# Patient Record
Sex: Female | Born: 1990
Health system: Southern US, Community
[De-identification: ages and names within clinical notes are randomized; demographics above are authoritative.]

## PROBLEM LIST (undated history)

## (undated) DIAGNOSIS — K219 Gastro-esophageal reflux disease without esophagitis: Secondary | ICD-10-CM

## (undated) DIAGNOSIS — F32A Depression, unspecified: Secondary | ICD-10-CM

## (undated) DIAGNOSIS — F329 Major depressive disorder, single episode, unspecified: Secondary | ICD-10-CM

## (undated) DIAGNOSIS — D241 Benign neoplasm of right breast: Secondary | ICD-10-CM

## (undated) DIAGNOSIS — D242 Benign neoplasm of left breast: Secondary | ICD-10-CM

## (undated) DIAGNOSIS — F419 Anxiety disorder, unspecified: Secondary | ICD-10-CM

## (undated) DIAGNOSIS — E282 Polycystic ovarian syndrome: Secondary | ICD-10-CM

## (undated) DIAGNOSIS — R002 Palpitations: Secondary | ICD-10-CM

## (undated) HISTORY — PX: WISDOM TOOTH EXTRACTION: SHX21

## (undated) HISTORY — DX: Gastro-esophageal reflux disease without esophagitis: K21.9

## (undated) HISTORY — DX: Polycystic ovarian syndrome: E28.2

## (undated) HISTORY — DX: Depression, unspecified: F32.A

## (undated) HISTORY — DX: Palpitations: R00.2

## (undated) HISTORY — DX: Benign neoplasm of right breast: D24.2

## (undated) HISTORY — DX: Benign neoplasm of right breast: D24.1

---
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
## 2012-03-11 ENCOUNTER — Emergency Department (HOSPITAL_COMMUNITY)
Admission: EM | Admit: 2012-03-11 | Discharge: 2012-03-11 | Disposition: A | Discharge status: Home / Self Care | Payer: No Typology Code available for payment source | Attending: Emergency Medicine | Admitting: Emergency Medicine

## 2012-03-11 ENCOUNTER — Encounter (HOSPITAL_COMMUNITY): Payer: Self-pay | Admitting: *Deleted

## 2012-03-11 DIAGNOSIS — R51 Headache: Secondary | ICD-10-CM | POA: Insufficient documentation

## 2012-03-11 DIAGNOSIS — Y92410 Unspecified street and highway as the place of occurrence of the external cause: Secondary | ICD-10-CM | POA: Insufficient documentation

## 2012-03-11 MED ORDER — IBUPROFEN 600 MG PO TABS: 600.0000 mg | ORAL_TABLET | Freq: Once | ORAL | Status: AC

## 2012-03-11 MED ORDER — IBUPROFEN 200 MG PO TABS
600.0000 mg | ORAL_TABLET | Freq: Once | ORAL | Status: AC
  Administered 2012-03-11: 600 mg via ORAL
  Filled 2012-03-11: qty 3

## 2012-03-11 NOTE — ED Provider Notes (Signed)
Medical screening examination/treatment/procedure(s) were performed by non-physician practitioner and as supervising physician I was immediately available for consultation/collaboration.   Loren Racer, MD 03/11/12 2322

## 2012-03-11 NOTE — Discharge Instructions (Signed)
° °

## 2012-03-11 NOTE — ED Notes (Signed)
rx x 0, pt voiced understanding to f/u with PCP and return for worsening condition.
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
## 2015-06-19 ENCOUNTER — Encounter (HOSPITAL_BASED_OUTPATIENT_CLINIC_OR_DEPARTMENT_OTHER): Payer: Self-pay | Admitting: *Deleted

## 2015-06-19 ENCOUNTER — Emergency Department (HOSPITAL_BASED_OUTPATIENT_CLINIC_OR_DEPARTMENT_OTHER): Payer: 59

## 2015-06-19 ENCOUNTER — Emergency Department (HOSPITAL_BASED_OUTPATIENT_CLINIC_OR_DEPARTMENT_OTHER)
Admission: EM | Admit: 2015-06-19 | Discharge: 2015-06-19 | Disposition: A | Discharge status: Home / Self Care | Payer: 59 | Attending: Emergency Medicine | Admitting: Emergency Medicine

## 2015-06-19 DIAGNOSIS — Z3202 Encounter for pregnancy test, result negative: Secondary | ICD-10-CM | POA: Insufficient documentation

## 2015-06-19 DIAGNOSIS — R112 Nausea with vomiting, unspecified: Secondary | ICD-10-CM

## 2015-06-19 DIAGNOSIS — R109 Unspecified abdominal pain: Secondary | ICD-10-CM | POA: Diagnosis not present

## 2015-06-19 DIAGNOSIS — Z79899 Other long term (current) drug therapy: Secondary | ICD-10-CM | POA: Insufficient documentation

## 2015-06-19 LAB — COMPREHENSIVE METABOLIC PANEL
ALBUMIN: 3.7 g/dL (ref 3.5–5.0)
ALT: 17 U/L (ref 14–54)
AST: 27 U/L (ref 15–41)
Alkaline Phosphatase: 64 U/L (ref 38–126)
Anion gap: 13 (ref 5–15)
BILIRUBIN TOTAL: 0.8 mg/dL (ref 0.3–1.2)
BUN: 26 mg/dL — AB (ref 6–20)
CHLORIDE: 103 mmol/L (ref 101–111)
CO2: 22 mmol/L (ref 22–32)
Calcium: 9.7 mg/dL (ref 8.9–10.3)
Creatinine, Ser: 0.84 mg/dL (ref 0.44–1.00)
GFR calc Af Amer: >60 mL/min (ref >60)
GFR calc non Af Amer: >60 mL/min (ref >60)
GLUCOSE: 94 mg/dL (ref 65–99)
POTASSIUM: 3.9 mmol/L (ref 3.5–5.1)
Sodium: 138 mmol/L (ref 135–145)
Total Protein: 8 g/dL (ref 6.5–8.1)

## 2015-06-19 LAB — CBC WITH DIFFERENTIAL/PLATELET
Basophils Absolute: 0 10*3/uL (ref 0.0–0.1)
Basophils Relative: 0 % (ref 0–1)
EOS PCT: 0 % (ref 0–5)
Eosinophils Absolute: 0 10*3/uL (ref 0.0–0.7)
HCT: 41 % (ref 36.0–46.0)
Hemoglobin: 14.3 g/dL (ref 12.0–15.0)
LYMPHS ABS: 1 10*3/uL (ref 0.7–4.0)
LYMPHS PCT: 11 % — AB (ref 12–46)
MCH: 28.8 pg (ref 26.0–34.0)
MCHC: 34.9 g/dL (ref 30.0–36.0)
MCV: 82.7 fL (ref 78.0–100.0)
MONO ABS: 0.3 10*3/uL (ref 0.1–1.0)
MONOS PCT: 4 % (ref 3–12)
Neutro Abs: 7.2 10*3/uL (ref 1.7–7.7)
Neutrophils Relative %: 85 % — ABNORMAL HIGH (ref 43–77)
PLATELETS: 245 10*3/uL (ref 150–400)
RBC: 4.96 MIL/uL (ref 3.87–5.11)
RDW: 12.1 % (ref 11.5–15.5)
WBC: 8.5 10*3/uL (ref 4.0–10.5)

## 2015-06-19 LAB — URINALYSIS, ROUTINE W REFLEX MICROSCOPIC
GLUCOSE, UA: NEGATIVE mg/dL
Hgb urine dipstick: NEGATIVE
Ketones, ur: >80 mg/dL — AB
LEUKOCYTES UA: NEGATIVE
Nitrite: NEGATIVE
PROTEIN: 30 mg/dL — AB
Specific Gravity, Urine: 1.031 — ABNORMAL HIGH (ref 1.005–1.030)
Urobilinogen, UA: 1 mg/dL (ref 0.0–1.0)
pH: 6 (ref 5.0–8.0)

## 2015-06-19 LAB — URINE MICROSCOPIC-ADD ON

## 2015-06-19 LAB — PREGNANCY, URINE: PREG TEST UR: NEGATIVE

## 2015-06-19 LAB — LIPASE, BLOOD: Lipase: 26 U/L (ref 22–51)

## 2015-06-19 MED ORDER — SODIUM CHLORIDE 0.9 % IV BOLUS (SEPSIS)
1000.0000 mL | Freq: Once | INTRAVENOUS | Status: AC
  Administered 2015-06-19: 1000 mL via INTRAVENOUS

## 2015-06-19 MED ORDER — ONDANSETRON HCL 4 MG/2ML IJ SOLN
4.0000 mg | Freq: Once | INTRAMUSCULAR | Status: AC
Start: 2015-06-19 — End: 2015-06-19
  Administered 2015-06-19: 4 mg via INTRAVENOUS
  Filled 2015-06-19: qty 2

## 2015-06-19 MED ORDER — ONDANSETRON 4 MG PO TBDP: ORAL_TABLET | ORAL | Status: DC

## 2015-06-19 NOTE — ED Notes (Signed)
Pt aware of need to provide urine sample and sts she will as soon as she can.

## 2015-06-19 NOTE — ED Notes (Signed)
Vomiting. Was seen at [facility] and told to come here. Right upper quad pain.

## 2015-06-19 NOTE — Discharge Instructions (Signed)
You were evaluated in the ED today for your nausea and vomiting. There is not appear to be an emergent cause her symptoms at this time. Her labs and ultrasound were very reassuring. It is important she take her medications as prescribed. Follow up with her doctors as needed for reevaluation. Return to ED for new or worsening symptoms.  Nausea and Vomiting Nausea is a sick feeling that often comes before throwing up (vomiting). Vomiting is a reflex where stomach contents come out of your mouth. Vomiting can cause severe loss of body fluids (dehydration). Children and elderly adults can become dehydrated quickly, especially if they also have diarrhea. Nausea and vomiting are symptoms of a condition or disease. It is important to find the cause of your symptoms. CAUSES   Direct irritation of the stomach lining. This irritation can result from increased acid production (gastroesophageal reflux disease), infection, food poisoning, taking certain medicines (such as nonsteroidal anti-inflammatory drugs), alcohol use, or tobacco use.  Signals from the brain.These signals could be caused by a headache, heat exposure, an inner ear disturbance, increased pressure in the brain from injury, infection, a tumor, or a concussion, pain, emotional stimulus, or metabolic problems.  An obstruction in the gastrointestinal tract (bowel obstruction).  Illnesses such as diabetes, hepatitis, gallbladder problems, appendicitis, kidney problems, cancer, sepsis, atypical symptoms of a heart attack, or eating disorders.  Medical treatments such as chemotherapy and radiation.  Receiving medicine that makes you sleep (general anesthetic) during surgery. DIAGNOSIS Your caregiver may ask for tests to be done if the problems do not improve after a few days. Tests may also be done if symptoms are severe or if the reason for the nausea and vomiting is not clear. Tests may include:  Urine tests.  Blood tests.  Stool  tests.  Cultures (to look for evidence of infection).  X-rays or other imaging studies. Test results can help your caregiver make decisions about treatment or the need for additional tests. TREATMENT You need to stay well hydrated. Drink frequently but in small amounts.You may wish to drink water, sports drinks, clear broth, or eat frozen ice pops or gelatin dessert to help stay hydrated.When you eat, eating slowly may help prevent nausea.There are also some antinausea medicines that may help prevent nausea. HOME CARE INSTRUCTIONS   Take all medicine as directed by your caregiver.  If you do not have an appetite, do not force yourself to eat. However, you must continue to drink fluids.  If you have an appetite, eat a normal diet unless your caregiver tells you differently.  Eat a variety of complex carbohydrates (rice, wheat, potatoes, bread), lean meats, yogurt, fruits, and vegetables.  Avoid high-fat foods because they are more difficult to digest.  Drink enough water and fluids to keep your urine clear or pale yellow.  If you are dehydrated, ask your caregiver for specific rehydration instructions. Signs of dehydration may include:  Severe thirst.  Dry lips and mouth.  Dizziness.  Dark urine.  Decreasing urine frequency and amount.  Confusion.  Rapid breathing or pulse. SEEK IMMEDIATE MEDICAL CARE IF:   You have blood or brown flecks (like coffee grounds) in your vomit.  You have black or bloody stools.  You have a severe headache or stiff neck.  You are confused.  You have severe abdominal pain.  You have chest pain or trouble breathing.  You do not urinate at least once every 8 hours.  You develop cold or clammy skin.  You continue to vomit  for longer than 24 to 48 hours.  You have a fever. MAKE SURE YOU:   Understand these instructions.  Will watch your condition.  Will get help right away if you are not doing well or get worse. Document  Released: 10/07/2005 Document Revised: 12/30/2011 Document Reviewed: 03/06/2011 [facility] Patient Information 2015 [place], [place]. This information is not intended to replace advice given to you by your health care provider. Make sure you discuss any questions you have with your health care provider.

## 2015-06-19 NOTE — ED Notes (Signed)
Water given for po challenge.

## 2015-06-19 NOTE — ED Notes (Signed)
Patient transported to Ultrasound 

## 2015-06-19 NOTE — ED Provider Notes (Signed)
CSN: [id number]     Arrival date & time 06/19/15  1300 History   First MD Initiated Contact with Patient 06/19/15 1313     Chief Complaint  Patient presents with  . Nausea     (Consider location/radiation/quality/duration/timing/severity/associated sxs/prior Treatment) HPI Darlene Wright is a 24 y.o. female who comes in for evaluation of nausea and abdominal pain. Patient states this morning at approximately 9 AM she began to have sudden onset nausea with vomiting. She reports she has been unable to keep down any fluids including water and ginger ale. She was seen at an urgent care facility and referred to the ED area she reports in the last 30 minutes she has also developed a right upper quadrant pain that has been somewhat constant in nature. Worse with palpation and deep respiration. She reports moderate alcohol use tonight prior to her symptoms, and estimates she drank roughly 4 shots. She also reports dark urine for the past 2 days but no dysuria or hematuria. She denies any fevers, chills, pelvic pain. She reports her last menstrual cycle was supposed to be 3 weeks ago, but she took the active pills in her birth control in order to avoid having a cycle this month due to ongoing anxiety. Rates discomfort now as a 2/10.  History reviewed. No pertinent past medical history. History reviewed. No pertinent past surgical history. No family history on file. Social History  Substance Use Topics  . Smoking status: Never Smoker   . Smokeless tobacco: None  . Alcohol Use: No   OB History    No data available     Review of Systems A 10 point review of systems was completed and was negative except for pertinent positives and negatives as mentioned in the history of present illness     Allergies  Review of patient's allergies indicates no known allergies.  Home Medications   Prior to Admission medications   Medication Sig Start Date End Date Taking? Authorizing Provider  FLUoxetine HCl  (PROZAC PO) Take by mouth.   Yes Historical Provider, MD  desogestrel-ethinyl estradiol (APRI,EMOQUETTE,SOLIA) 0.15-30 MG-MCG tablet Take 1 tablet by mouth daily.    Historical Provider, MD  ondansetron (ZOFRAN ODT) 4 MG disintegrating tablet 4mg  ODT q4 hours prn nausea/vomit 06/19/15   Emmajo Bennette, PA-C   BP 110/57 mmHg  Pulse 57  Temp(Src) 98.5 F (36.9 C) (Oral)  Resp 20  Ht 5' 5.5" (1.664 m)  Wt 127 lb (57.607 kg)  BMI 20.81 kg/m2  SpO2 100% Physical Exam  Constitutional: She is oriented to person, place, and time. She appears well-developed and well-nourished.  HENT:  Head: Normocephalic and atraumatic.  Mouth/Throat: Oropharynx is clear and moist.  Eyes: Conjunctivae are normal. Pupils are equal, round, and reactive to light. Right eye exhibits no discharge. Left eye exhibits no discharge. No scleral icterus.  Neck: Normal range of motion. Neck supple.  Cardiovascular: Normal rate, regular rhythm and normal heart sounds.   Pulmonary/Chest: Effort normal and breath sounds normal. No respiratory distress. She has no wheezes. She has no rales.  Abdominal: Soft.  Diffuse epigastric and right upper quadrant tenderness. Positive Murphy sign. Abdomen is soft, nondistended. No other lesions or deformities noted. CVA tenderness on right side  Musculoskeletal: Normal range of motion. She exhibits no tenderness.  Neurological: She is alert and oriented to person, place, and time.  Cranial Nerves II-XII grossly intact  Skin: Skin is warm and dry. No rash noted.  Psychiatric: She has a normal mood  and affect.  Nursing note and vitals reviewed.   ED Course  Procedures (including critical care time) Labs Review Labs Reviewed  URINALYSIS, ROUTINE W REFLEX MICROSCOPIC (NOT AT [facility]) - Abnormal; Notable for the following:    Color, Urine AMBER (*)    APPearance CLOUDY (*)    Specific Gravity, Urine 1.031 (*)    Bilirubin Urine SMALL (*)    Ketones, ur >80 (*)    Protein, ur 30 (*)     All other components within normal limits  COMPREHENSIVE METABOLIC PANEL - Abnormal; Notable for the following:    BUN 26 (*)    All other components within normal limits  CBC WITH DIFFERENTIAL/PLATELET - Abnormal; Notable for the following:    Neutrophils Relative % 85 (*)    Lymphocytes Relative 11 (*)    All other components within normal limits  URINE MICROSCOPIC-ADD ON - Abnormal; Notable for the following:    Squamous Epithelial / LPF FEW (*)    Bacteria, UA FEW (*)    All other components within normal limits  PREGNANCY, URINE  LIPASE, BLOOD    Imaging Review US Abdomen Complete  06/19/2015   CLINICAL DATA:  Right upper quadrant pain beginning at noon today. Nausea and vomiting prior earlier in the day. Initial encounter.  EXAM: ULTRASOUND ABDOMEN COMPLETE  COMPARISON:  None.  FINDINGS: Gallbladder: No gallstones or wall thickening visualized. No sonographic Murphy sign noted.  Common bile duct: Diameter: 0.3 cm  Liver: No focal lesion identified. Within normal limits in parenchymal echogenicity.  IVC: No abnormality visualized.  Pancreas: Visualized portion unremarkable.  Spleen: Size and appearance within normal limits.  Right Kidney: Length: 10.4 cm. Echogenicity within normal limits. No mass or hydronephrosis visualized.  Left Kidney: Length: 9.0 cm. Echogenicity within normal limits. No mass or hydronephrosis visualized.  Abdominal aorta: No aneurysm visualized.  Other findings: None.  IMPRESSION: Negative for gallstones.  Negative exam.   Electronically Signed   By: Inge Rise M.D.   On: 06/19/2015 15:46   I have personally reviewed and evaluated these images and lab results as part of my medical decision-making.   EKG Interpretation None     Meds given in ED:  Medications  sodium chloride 0.9 % bolus 1,000 mL (0 mLs Intravenous Stopped 06/19/15 1415)  ondansetron (ZOFRAN) injection 4 mg (4 mg Intravenous Given 06/19/15 1346)  ondansetron (ZOFRAN) injection 4 mg (4  mg Intravenous Given 06/19/15 1549)    Discharge Medication List as of 06/19/2015  4:28 PM     Filed Vitals:   06/19/15 1307 06/19/15 1536 06/19/15 1641  BP: 112/83 94/56 110/57  Pulse: 95 64 57  Temp: 97.5 F (36.4 C) 98.5 F (36.9 C)   TempSrc: Oral Oral   Resp: 18 18 20   Height: 5' 5.5" (1.664 m)    Weight: 127 lb (57.607 kg)    SpO2: 100% 100% 100%    MDM  Vitals stable - WNL -afebrile Pt resting comfortably in ED. PE-- Diffuse RUQ pain with otherwise benign abd exam. When palpating CVA tenderness, patient reports discomfort anteriorly in right upper quadrant. Labwork--labs are noncontributory Imaging--US abdomen negative  DDX--patient is tolerating oral fluids in the ED without vomiting. Reports she feels much better. Right upper quadrant pain has abated. Symptoms possibly secondary to biliary spasm. No evidence of cholelithiasis, cholecystitis. No evidence of ureteral stone, hydronephrosis or pyelonephritis. Doubt appendicitis. Low suspicion for any other intra-abdominal pathology. Will DC with antinausea medicines and instructions for hydration at home.  I discussed all relevant lab findings and imaging results with pt and they verbalized understanding. Discussed f/u with PCP within 48 hrs and return precautions, pt very amenable to plan.  Final diagnoses:  Non-intractable vomiting with nausea, vomiting of unspecified type      Comer Locket, PA-C 06/20/15 5521  Malvin Johns, MD 06/20/15 [phone]

## 2016-10-19 ENCOUNTER — Emergency Department (HOSPITAL_BASED_OUTPATIENT_CLINIC_OR_DEPARTMENT_OTHER): Payer: 59

## 2016-10-19 ENCOUNTER — Emergency Department (HOSPITAL_BASED_OUTPATIENT_CLINIC_OR_DEPARTMENT_OTHER)
Admission: EM | Admit: 2016-10-19 | Discharge: 2016-10-20 | Disposition: A | Discharge status: Home / Self Care | Payer: 59 | Attending: Emergency Medicine | Admitting: Emergency Medicine

## 2016-10-19 ENCOUNTER — Encounter (HOSPITAL_BASED_OUTPATIENT_CLINIC_OR_DEPARTMENT_OTHER): Payer: Self-pay | Admitting: Emergency Medicine

## 2016-10-19 DIAGNOSIS — B9789 Other viral agents as the cause of diseases classified elsewhere: Secondary | ICD-10-CM

## 2016-10-19 DIAGNOSIS — J069 Acute upper respiratory infection, unspecified: Secondary | ICD-10-CM | POA: Insufficient documentation

## 2016-10-19 DIAGNOSIS — R0602 Shortness of breath: Secondary | ICD-10-CM

## 2016-10-19 DIAGNOSIS — J40 Bronchitis, not specified as acute or chronic: Secondary | ICD-10-CM | POA: Insufficient documentation

## 2016-10-19 HISTORY — DX: Anxiety disorder, unspecified: F41.9

## 2016-10-19 LAB — CBC WITH DIFFERENTIAL/PLATELET
BASOS ABS: 0 10*3/uL (ref 0.0–0.1)
BASOS PCT: 0 %
Eosinophils Absolute: 0.2 10*3/uL (ref 0.0–0.7)
Eosinophils Relative: 2 %
HEMATOCRIT: 41 % (ref 36.0–46.0)
Hemoglobin: 14.2 g/dL (ref 12.0–15.0)
Lymphocytes Relative: 21 %
Lymphs Abs: 2.1 10*3/uL (ref 0.7–4.0)
MCH: 28.6 pg (ref 26.0–34.0)
MCHC: 34.6 g/dL (ref 30.0–36.0)
MCV: 82.7 fL (ref 78.0–100.0)
MONO ABS: 1.1 10*3/uL — AB (ref 0.1–1.0)
Monocytes Relative: 10 %
NEUTROS ABS: 6.9 10*3/uL (ref 1.7–7.7)
NEUTROS PCT: 67 %
Platelets: 227 10*3/uL (ref 150–400)
RBC: 4.96 MIL/uL (ref 3.87–5.11)
RDW: 12.3 % (ref 11.5–15.5)
WBC: 10.3 10*3/uL (ref 4.0–10.5)

## 2016-10-19 LAB — PREGNANCY, URINE: PREG TEST UR: NEGATIVE

## 2016-10-19 LAB — D-DIMER, QUANTITATIVE: D-Dimer, Quant: <0.27 ug/mL-FEU (ref 0.00–0.50)

## 2016-10-19 MED ORDER — ALBUTEROL SULFATE (2.5 MG/3ML) 0.083% IN NEBU
5.0000 mg | INHALATION_SOLUTION | Freq: Once | RESPIRATORY_TRACT | Status: AC
  Administered 2016-10-19: 5 mg via RESPIRATORY_TRACT
  Filled 2016-10-19: qty 6

## 2016-10-19 MED ORDER — IOPAMIDOL (ISOVUE-370) INJECTION 76%
100.0000 mL | Freq: Once | INTRAVENOUS | Status: AC | PRN
  Administered 2016-10-19: 100 mL via INTRAVENOUS

## 2016-10-19 MED ORDER — LORAZEPAM 1 MG PO TABS
0.5000 mg | ORAL_TABLET | Freq: Once | ORAL | Status: AC
  Administered 2016-10-19: 0.5 mg via ORAL
  Filled 2016-10-19: qty 1

## 2016-10-19 NOTE — ED Notes (Signed)
Patient transported to X-ray 

## 2016-10-19 NOTE — ED Provider Notes (Signed)
[place] DEPT [facility] Provider Note   CSN: [id number] Arrival date & time: 10/19/16  1551 By signing my name below, I, [name], attest that this documentation has been prepared under the direction and in the presence of non-physician practitioner, Irena Cords, PA-C. Electronically Signed: [name], Scribe. 10/19/2016. 7:52 PM.   History   Chief Complaint Chief Complaint  Patient presents with  . Shortness of Breath   HPI Darlene Wright is a 25 y.o. female with a hx of anxiety who presents to the Emergency Department complaining of shortness of breath onset yesterday. Pt states SOB is exacerbated by talking or exertion. Pt notes associated cough productive of red tinged sputum, back pain, and postnasal drip which has improved. Per pt, her symptoms began with cough three days ago. She has taken OTC cough and cold medication with no relief. Pt was seen at urgent care today and sent to the ED for further evaluation. Pt denies vomiting, diarrhea, abdominal pain, or sore throat.   The history is provided by the patient. No language interpreter was used.   Past Medical History:  Diagnosis Date  . Anxiety     Patient Active Problem List   Diagnosis Date Noted  . BREAST MASS, LEFT 05/08/2009    Past Surgical History:  Procedure Laterality Date  . WISDOM TOOTH EXTRACTION      OB History    No data available      Home Medications    Prior to Admission medications   Medication Sig Start Date End Date Taking? Authorizing Provider  hydrOXYzine (ATARAX/VISTARIL) 10 MG tablet Take 10 mg by mouth 3 (three) times daily as needed.   Yes Historical Provider, MD  desogestrel-ethinyl estradiol (APRI,EMOQUETTE,SOLIA) 0.15-30 MG-MCG tablet Take 1 tablet by mouth daily.    Historical Provider, MD  FLUoxetine HCl (PROZAC PO) Take by mouth.    Historical Provider, MD  ondansetron (ZOFRAN ODT) 4 MG disintegrating tablet 4mg  ODT q4 hours prn nausea/vomit 06/19/15   Comer Locket,  PA-C    Family History No family history on file.  Social History Social History  Substance Use Topics  . Smoking status: Never Smoker  . Smokeless tobacco: Never Used  . Alcohol use Yes     Comment: social    Allergies   Patient has no known allergies.  Review of Systems Review of Systems 10 systems reviewed and all are negative for acute change except as noted in the HPI.   Physical Exam Updated Vital Signs BP 124/77 (BP Location: Right Arm)   Pulse 92   Temp 98.1 F (36.7 C) (Oral)   Resp 20   Ht 5\' 5"  (1.651 m)   Wt 138 lb (62.6 kg)   LMP 10/12/2016   SpO2 100%   BMI 22.96 kg/m   Physical Exam  Constitutional: She is oriented to person, place, and time. She appears well-developed and well-nourished. No distress.  HENT:  Head: Normocephalic and atraumatic.  Mouth/Throat: Oropharynx is clear and moist.  Eyes: Pupils are equal, round, and reactive to light.  Neck: Normal range of motion. Neck supple.  Cardiovascular: Normal rate, regular rhythm and normal heart sounds.  Exam reveals no gallop and no friction rub.   No murmur heard. Pulmonary/Chest: Effort normal and breath sounds normal. No respiratory distress. She has no wheezes.  Abdominal: Soft. Bowel sounds are normal. She exhibits no distension. There is no tenderness.  Musculoskeletal: She exhibits no edema.  Neurological: She is alert and oriented to person, place, and time.  She exhibits normal muscle tone. Coordination normal.  Skin: Skin is warm and dry. Capillary refill takes less than 2 seconds. No rash noted. No erythema.  Psychiatric: Her behavior is normal. Her mood appears anxious.  Nursing note and vitals reviewed.  ED Treatments / Results  DIAGNOSTIC STUDIES:  Oxygen Saturation is 100% on RA, normal by my interpretation.    COORDINATION OF CARE:  7:46 PM Discussed treatment plan with pt at bedside and pt agreed to plan.   Labs (all labs ordered are listed, but only abnormal results are  displayed) Labs Reviewed  CBC WITH DIFFERENTIAL/PLATELET - Abnormal; Notable for the following:       Result Value   Monocytes Absolute 1.1 (*)    All other components within normal limits  PREGNANCY, URINE  D-DIMER, QUANTITATIVE (NOT AT [facility])    EKG  EKG Interpretation None       Radiology Dg Chest 2 View  Result Date: 10/19/2016 CLINICAL DATA:  Complaining of sinus infection since October 16, 2016. Shortness of breath since yesterday. EXAM: CHEST  2 VIEW COMPARISON:  None. FINDINGS: The heart size and mediastinal contours are within normal limits. There is no focal infiltrate, pulmonary edema, or pleural effusion. The visualized skeletal structures are unremarkable. IMPRESSION: No active cardiopulmonary disease. Electronically Signed   By: Abelardo Diesel M.D.   On: 10/19/2016 16:44   Procedures Procedures (including critical care time)  Medications Ordered in ED Medications - No data to display  Initial Impression / Assessment and Plan / ED Course  I have reviewed the triage vital signs and the nursing notes.  Pertinent labs & imaging results that were available during my care of the patient were reviewed by me and considered in my medical decision making (see chart for details).  Clinical Course    Patient most likely has an upper respiratory infection.  We will treat for this.  The patient's heart rate is probably due to the fact that she is anxious, plus the fact she had albuterol patient.  CT of her chest does not show any pulmonary embolus or other abnormality.  I advised the patient of the results and all questions were answered  Final Clinical Impressions(s) / ED Diagnoses   Final diagnoses:  None    New Prescriptions New Prescriptions   No medications on file  I personally performed the services described in this documentation, which was scribed in my presence. The recorded information has been reviewed and is accurate.   Dalia Heading, PA-C 10/20/16  0021    Julianne Rice, MD 10/20/16 [phone]

## 2016-10-19 NOTE — ED Notes (Signed)
Pt taken to CT.

## 2016-10-19 NOTE — ED Notes (Signed)
Pt ambulated around nurses station with HR up to 127 and 02 saturation of 98%.

## 2016-10-19 NOTE — ED Triage Notes (Addendum)
States thought she has a sinus infection and cough and SOB, and was eval at Urgent Care for same and was sent here for further work up. Also feeling" anxious" in triage. States is unable to take a deep breath and pain in mid-back.

## 2016-10-19 NOTE — ED Notes (Signed)
Pt ambulated around dept. HR 140 SATS 100%.

## 2016-10-20 MED ORDER — PREDNISONE 50 MG PO TABS: 50.0000 mg | ORAL_TABLET | Freq: Every day | ORAL | 0 refills | Status: DC

## 2016-10-20 MED ORDER — GUAIFENESIN ER 1200 MG PO TB12: 1.0000 | ORAL_TABLET | Freq: Two times a day (BID) | ORAL | 0 refills | Status: DC

## 2016-10-20 MED ORDER — PROMETHAZINE-DM 6.25-15 MG/5ML PO SYRP: 5.0000 mL | ORAL_SOLUTION | Freq: Four times a day (QID) | ORAL | 0 refills | Status: DC | PRN

## 2016-10-20 NOTE — ED Notes (Signed)
Pt made aware to return if symptoms worsen or if any life threatening symptoms occur.   

## 2016-10-20 NOTE — Discharge Instructions (Signed)
Your CT angiogram of her chest did not show any abnormalities or pulmonary embolism.  You most likely have a upper respiratory infection.  We will give you medications for this.  Increase your fluid intake and rest as much as possible.

## 2017-10-24 DIAGNOSIS — F4322 Adjustment disorder with anxiety: Secondary | ICD-10-CM | POA: Diagnosis not present

## 2017-10-29 ENCOUNTER — Other Ambulatory Visit: Payer: Self-pay | Admitting: Family Medicine

## 2017-10-29 DIAGNOSIS — N6011 Diffuse cystic mastopathy of right breast: Secondary | ICD-10-CM

## 2017-10-29 DIAGNOSIS — N6489 Other specified disorders of breast: Secondary | ICD-10-CM

## 2017-10-29 DIAGNOSIS — N6012 Diffuse cystic mastopathy of left breast: Secondary | ICD-10-CM

## 2017-10-30 DIAGNOSIS — H5211 Myopia, right eye: Secondary | ICD-10-CM | POA: Diagnosis not present

## 2017-11-07 DIAGNOSIS — F4322 Adjustment disorder with anxiety: Secondary | ICD-10-CM | POA: Diagnosis not present

## 2017-12-23 DIAGNOSIS — F4322 Adjustment disorder with anxiety: Secondary | ICD-10-CM | POA: Diagnosis not present

## 2018-01-28 ENCOUNTER — Telehealth: Payer: Self-pay | Admitting: Obstetrics and Gynecology

## 2018-01-28 NOTE — Telephone Encounter (Signed)
Called and left a message for patient to call back to schedule a new patient doctor referral appointment with our office for a birth control consultation.

## 2018-02-04 ENCOUNTER — Ambulatory Visit
Admission: RE | Admit: 2018-02-04 | Discharge: 2018-02-04 | Disposition: A | Discharge status: Home / Self Care | Payer: 59 | Source: Ambulatory Visit | Attending: Family Medicine | Admitting: Family Medicine

## 2018-02-04 DIAGNOSIS — N6012 Diffuse cystic mastopathy of left breast: Secondary | ICD-10-CM

## 2018-02-04 DIAGNOSIS — N6011 Diffuse cystic mastopathy of right breast: Secondary | ICD-10-CM

## 2018-02-04 DIAGNOSIS — N6489 Other specified disorders of breast: Secondary | ICD-10-CM

## 2018-02-04 DIAGNOSIS — F4323 Adjustment disorder with mixed anxiety and depressed mood: Secondary | ICD-10-CM | POA: Diagnosis not present

## 2018-02-04 DIAGNOSIS — D242 Benign neoplasm of left breast: Secondary | ICD-10-CM | POA: Diagnosis not present

## 2018-02-10 DIAGNOSIS — Z13220 Encounter for screening for lipoid disorders: Secondary | ICD-10-CM | POA: Diagnosis not present

## 2018-02-10 DIAGNOSIS — Z131 Encounter for screening for diabetes mellitus: Secondary | ICD-10-CM | POA: Diagnosis not present

## 2018-02-10 DIAGNOSIS — Z1329 Encounter for screening for other suspected endocrine disorder: Secondary | ICD-10-CM | POA: Diagnosis not present

## 2018-02-18 ENCOUNTER — Encounter

## 2018-02-18 ENCOUNTER — Encounter: Payer: Self-pay | Admitting: Obstetrics and Gynecology

## 2018-02-18 ENCOUNTER — Ambulatory Visit: Payer: BLUE CROSS/BLUE SHIELD | Admitting: Obstetrics and Gynecology

## 2018-02-18 ENCOUNTER — Other Ambulatory Visit: Payer: Self-pay

## 2018-02-18 VITALS — BP 114/60 | HR 76 | Resp 16 | Ht 64.5 in | Wt 145.0 lb

## 2018-02-18 DIAGNOSIS — Z01419 Encounter for gynecological examination (general) (routine) without abnormal findings: Secondary | ICD-10-CM | POA: Diagnosis not present

## 2018-02-18 DIAGNOSIS — Z3009 Encounter for other general counseling and advice on contraception: Secondary | ICD-10-CM | POA: Diagnosis not present

## 2018-02-18 NOTE — Patient Instructions (Signed)

## 2018-02-18 NOTE — Progress Notes (Signed)
27 y.o. G39P0000 Married Caucasian female here for annual exam.    Husband present for the entire visit today.   Decreased libido on OCPs.  On Apri for 8 years - Light menses, acne improved.  Decreased libido. Tried another OCP with low estrogen and still had decreased libido.  Considering Paragard IUD.  PCP:    Dr. Antoine Primas   Patient's last menstrual period was 02/10/2018.     Period Cycle (Days): 30 Period Duration (Days): 4-5  Period Pattern: (!) Irregular Menstrual Flow: Light Menstrual Control: Maxi pad, Tampon Dysmenorrhea: None     Sexually active: Yes.    The current method of family planning is OCP (estrogen/progesterone).    Exercising: No.  The patient does not participate in regular exercise at present. Smoker:  no  Health Maintenance: Pap:  12/2015 in [place]  History of abnormal Pap:  no MMG:  02/04/18 - bilateral breast ultrasound- BIRADS 2 benign   Colonoscopy:  never BMD:   never  Result  n/a TDaP:  05/12/15  Gardasil:   Yes- completed all 3  HIV: never Hep C: never Screening Labs:  Hb today: done recently , Urine today: has sample if needed    reports that she has never smoked. She has never used smokeless tobacco. She reports that she drinks alcohol. She reports that she does not use drugs.  Past Medical History:  Diagnosis Date  . Anxiety   . Fibroadenoma of both breasts     Past Surgical History:  Procedure Laterality Date  . WISDOM TOOTH EXTRACTION    . WISDOM TOOTH EXTRACTION      Current Outpatient Medications  Medication Sig Dispense Refill  . hydrOXYzine (ATARAX/VISTARIL) 10 MG tablet Take 10 mg by mouth 3 (three) times daily as needed.    . Multiple Vitamins-Minerals (MULTIVITAMIN PO) Take by mouth.    . SRONYX 0.1-20 MG-MCG tablet Take 1 tablet by mouth daily.  1   No current facility-administered medications for this visit.     Family History  Problem Relation Age of Onset  . Hypertension Mother   . Hypertension  Father   . Diabetes Maternal Grandmother   . Hypertension Maternal Grandmother   . Hypertension Maternal Grandfather   . Hypertension Paternal Grandmother   . Leukemia Paternal Grandfather   . Diabetes Paternal Grandfather   . Hypertension Paternal Grandfather     Review of Systems  Constitutional: Negative.   HENT: Negative.   Eyes: Negative.   Respiratory: Negative.   Cardiovascular: Negative.   Gastrointestinal: Negative.   Endocrine: Negative.   Genitourinary: Negative.   Musculoskeletal: Negative.   Skin: Negative.   Allergic/Immunologic: Negative.   Neurological: Negative.   Hematological: Negative.   Psychiatric/Behavioral: Negative.     Exam:   BP 114/60   Pulse 76   Resp 16   Ht 5' 4.5" (1.638 m)   Wt 145 lb (65.8 kg)   LMP 02/10/2018   BMI 24.50 kg/m     General appearance: alert, cooperative and appears stated age Head: Normocephalic, without obvious abnormality, atraumatic Neck: no adenopathy, supple, symmetrical, trachea midline and thyroid normal to inspection and palpation Lungs: clear to auscultation bilaterally Breasts: normal appearance, no masses or tenderness, No nipple retraction or dimpling, No nipple discharge or bleeding, No axillary or supraclavicular adenopathy Heart: regular rate and rhythm Abdomen: soft, non-tender; no masses, no organomegaly Extremities: extremities normal, atraumatic, no cyanosis or edema Skin: Skin color, texture, turgor normal. No rashes or lesions Lymph nodes: Cervical,  supraclavicular, and axillary nodes normal. No abnormal inguinal nodes palpated Neurologic: Grossly normal  Pelvic: External genitalia:  no lesions              Urethra:  normal appearing urethra with no masses, tenderness or lesions              Bartholins and Skenes: normal                 Vagina: normal appearing vagina with normal color and discharge, no lesions              Cervix: no lesions              Pap taken: No. Bimanual Exam:   Uterus:  normal size, contour, position, consistency, mobility, non-tender              Adnexa: no mass, fullness, tenderness            Chaperone was present for exam.  Assessment:   Well woman visit with normal exam. Hx bilateral fibroadenomas of breast.  Desire for long acting contraception.  Plan: Mammogram screening age 13. Recommended self breast awareness. Pap and HR HPV as above. Guidelines for Calcium, Vitamin D, regular exercise program including cardiovascular and weight bearing exercise. Discussed ultra low dose combined OCPs, Micronor, Nexplanon, and IUDs.  She prefers [place] IUD.  Risks and benefits reviewed.  Will plan for cytotec 200 mcg night prior and am of insertion.  Will also do paracervical block.  Brochure given.  Follow up annually and prn.   After visit summary provided.

## 2018-02-21 DIAGNOSIS — F4323 Adjustment disorder with mixed anxiety and depressed mood: Secondary | ICD-10-CM | POA: Diagnosis not present

## 2018-02-23 ENCOUNTER — Telehealth: Payer: Self-pay | Admitting: Obstetrics and Gynecology

## 2018-02-23 MED ORDER — MISOPROSTOL 200 MCG PO TABS: ORAL_TABLET | ORAL | 0 refills | Status: DC

## 2018-02-23 NOTE — Telephone Encounter (Signed)
Patient called requesting to schedule [name] insertion. She said she started her menstrual cycle today.  Cc: Darlene Wright

## 2018-02-23 NOTE — Telephone Encounter (Signed)
Patient returned call and request to schedule for 02/27/18 at 1pm for IUD insertion, if still available. Procedure scheduled for 02/27/18 at 1pm with Dr. Quincy Simmonds.  Encounter closed.

## 2018-02-23 NOTE — Telephone Encounter (Signed)
No answer, mailbox full, unable to leave message.

## 2018-02-23 NOTE — Telephone Encounter (Signed)
Spoke with patient. On OCP, LMP 02/23/18. Would like to proceed with [facility] IUD insertion.   Cytotec instructions reviewed and sent to verified pharmacy. Advised to take Motrin 800 mg with food and water one hour before procedure.  IUD insertion scheduled for 02/27/18 at 11am with Dr. Quincy Simmonds.   Order previously placed for IUD insertion.   Routing to provider for final review. Patient is agreeable to disposition. Will close encounter.  Cc: Lerry Liner

## 2018-02-27 ENCOUNTER — Encounter: Payer: Self-pay | Admitting: Obstetrics and Gynecology

## 2018-02-27 ENCOUNTER — Other Ambulatory Visit: Payer: Self-pay

## 2018-02-27 ENCOUNTER — Telehealth: Payer: Self-pay | Admitting: Obstetrics and Gynecology

## 2018-02-27 ENCOUNTER — Ambulatory Visit: Payer: Self-pay | Admitting: Obstetrics and Gynecology

## 2018-02-27 ENCOUNTER — Ambulatory Visit (INDEPENDENT_AMBULATORY_CARE_PROVIDER_SITE_OTHER): Payer: BLUE CROSS/BLUE SHIELD | Admitting: Obstetrics and Gynecology

## 2018-02-27 VITALS — BP 110/60 | HR 60 | Resp 14 | Ht 64.5 in | Wt 147.0 lb

## 2018-02-27 DIAGNOSIS — Z3009 Encounter for other general counseling and advice on contraception: Secondary | ICD-10-CM | POA: Diagnosis not present

## 2018-02-27 DIAGNOSIS — Z30430 Encounter for insertion of intrauterine contraceptive device: Secondary | ICD-10-CM

## 2018-02-27 LAB — POCT URINE PREGNANCY: Preg Test, Ur: NEGATIVE

## 2018-02-27 NOTE — Patient Instructions (Signed)

## 2018-02-27 NOTE — Progress Notes (Signed)
GYNECOLOGY  VISIT   HPI: 27 y.o.   Married  Caucasian  female   G0P0000 with Patient's last menstrual period was 02/23/2018.   here for IUD insertion .  Wants [place] IUD.  Patient states that she has taken 500mg  of Tylenol at 12:00pm -- patient has also taken 325mg  of sinus med that has Acetaminophen in it. Per patient only took Cytotec this morning. Did not insert Cytotec last night  GYNECOLOGIC HISTORY: Patient's last menstrual period was 02/23/2018. Contraception:  OCP Menopausal hormone therapy:  n/a Last mammogram:  n/a Last pap smear: 05/15/15  Pap smear Negative -- in Care Everywhere        OB History    Gravida  0   Para  0   Term  0   Preterm  0   AB  0   Living  0     SAB  0   TAB  0   Ectopic  0   Multiple  0   Live Births  0              Patient Active Problem List   Diagnosis Date Noted  . BREAST MASS, LEFT 05/08/2009    Past Medical History:  Diagnosis Date  . Anxiety   . Fibroadenoma of both breasts     Past Surgical History:  Procedure Laterality Date  . WISDOM TOOTH EXTRACTION    . WISDOM TOOTH EXTRACTION      Current Outpatient Medications  Medication Sig Dispense Refill  . hydrOXYzine (ATARAX/VISTARIL) 10 MG tablet Take 10 mg by mouth 3 (three) times daily as needed.    . Multiple Vitamins-Minerals (MULTIVITAMIN PO) Take by mouth.    . SRONYX 0.1-20 MG-MCG tablet Take 1 tablet by mouth daily.  1   No current facility-administered medications for this visit.      ALLERGIES: Patient has no known allergies.  Family History  Problem Relation Age of Onset  . Hypertension Mother   . Hypertension Father   . Diabetes Maternal Grandmother   . Hypertension Maternal Grandmother   . Hypertension Maternal Grandfather   . Hypertension Paternal Grandmother   . Leukemia Paternal Grandfather   . Diabetes Paternal Grandfather   . Hypertension Paternal Grandfather     Social History   Socioeconomic History  . Marital status:  Married    Spouse name: Not on file  . Number of children: Not on file  . Years of education: Not on file  . Highest education level: Not on file  Occupational History  . Not on file  Social Needs  . Financial resource strain: Not on file  . Food insecurity:    Worry: Not on file    Inability: Not on file  . Transportation needs:    Medical: Not on file    Non-medical: Not on file  Tobacco Use  . Smoking status: Never Smoker  . Smokeless tobacco: Never Used  Substance and Sexual Activity  . Alcohol use: Yes    Comment: social  . Drug use: No  . Sexual activity: Yes    Birth control/protection: Pill  Lifestyle  . Physical activity:    Days per week: Not on file    Minutes per session: Not on file  . Stress: Not on file  Relationships  . Social connections:    Talks on phone: Not on file    Gets together: Not on file    Attends religious service: Not on file    Active member  of club or organization: Not on file    Attends meetings of clubs or organizations: Not on file    Relationship status: Not on file  . Intimate partner violence:    Fear of current or ex partner: Not on file    Emotionally abused: Not on file    Physically abused: Not on file    Forced sexual activity: Not on file  Other Topics Concern  . Not on file  Social History Narrative  . Not on file    Review of Systems  Constitutional: Negative.   HENT: Negative.   Eyes: Negative.   Respiratory: Negative.   Cardiovascular: Negative.   Gastrointestinal: Negative.   Endocrine: Negative.   Genitourinary: Negative.   Musculoskeletal: Negative.   Skin: Negative.   Allergic/Immunologic: Negative.   Neurological: Negative.   Hematological: Negative.   Psychiatric/Behavioral: Negative.     PHYSICAL EXAMINATION:    BP 110/60 (BP Location: Right Arm, Patient Position: Sitting, Cuff Size: Normal)   Pulse 60   Resp 14   Ht 5' 4.5" (1.638 m)   Wt 147 lb (66.7 kg)   LMP 02/23/2018   BMI 24.84  kg/m     General appearance: alert, cooperative and appears stated age    Pelvic: External genitalia:  no lesions              Urethra:  normal appearing urethra with no masses, tenderness or lesions              Bartholins and Skenes: normal                 Vagina: normal appearing vagina with normal color and discharge, no lesions              Cervix: no lesions                Bimanual Exam:  Uterus:  normal size, contour, position, consistency, mobility, non-tender              Adnexa: no mass, fullness, tenderness         [name] IUD insertion.  Lot [id number], exp Jan 2021.  Consent for procedure.  Hibiclens prep.  Paracervical block with 10 cc 1% lidocaine, lot [id number], exp 01/23. Uterus sounded to 7 cm.  [name] placed without difficulty.  String trimmed to 3 cm.  Repeat BM exam - no change.  Minimal EBL.  No complications.   Chaperone was present for exam.  ASSESSMENT  [name] IUD insertion.   PLAN  Insertion card and brochure to patient.  Instructions/precautions given.  FU in 4 weeks.  An After Visit Summary was printed and given to the patient.

## 2018-02-27 NOTE — Telephone Encounter (Signed)
Spoke with patient. Advised of message as seen below from Dr.Silva. Patient verbalizes understanding. Encounter closed. 

## 2018-02-27 NOTE — Telephone Encounter (Signed)
Routing to [place] for review and advise.

## 2018-02-27 NOTE — Telephone Encounter (Signed)
Just one Cytotec this morning is ok.

## 2018-02-27 NOTE — Telephone Encounter (Signed)
Patient has IUD insertion this after at 1:00. Was unable to get to the pharmacy yesterday therefore did not have cytotec for last night's dose. She wants to know if she should do both today before the procedure or not.

## 2018-03-09 DIAGNOSIS — F4323 Adjustment disorder with mixed anxiety and depressed mood: Secondary | ICD-10-CM | POA: Diagnosis not present

## 2018-03-11 ENCOUNTER — Telehealth: Payer: Self-pay | Admitting: Obstetrics and Gynecology

## 2018-03-11 DIAGNOSIS — Z30431 Encounter for routine checking of intrauterine contraceptive device: Secondary | ICD-10-CM

## 2018-03-11 DIAGNOSIS — R102 Pelvic and perineal pain: Secondary | ICD-10-CM

## 2018-03-11 NOTE — Telephone Encounter (Signed)
Patient has been experiencing really bad cramping and abdominal pain after iud procedure.

## 2018-03-11 NOTE — Telephone Encounter (Signed)
Reviewed with Dr. Quincy Simmonds, call returned to patient. Patient reports unable to feel IUD strings, is not comfortable yet with locating them.  1. PUS scheduled for 5/23 at 3:30pm, consult at 4pm with Dr. Quincy Simmonds. Order placed.  2. Motrin 800 mg q8hr prn for pain. Heating pad for comfort.   3. ER precautions review for [facility] if pain becomes severe or new symptoms develop.  Patient verbalizes understanding.  Routing to provider for final review. Patient is agreeable to disposition. Will close encounter.   Cc: Darlene Wright

## 2018-03-11 NOTE — Telephone Encounter (Signed)
Spoke with patient. [name] IUD placed 02/27/18. Reports increased intermittent pelvic pain that comes in waves. Pain is increased with position changes such as bending. Nausea when pain is severe, no vomiting. Abdomen is bloated, reports normal BM. Denies fever/chills. Took acetaminophen 2 days ago, no relief. Currently 7/10. Spotting, no heavy bleeding, has not checked for IUD string. has not been SA since placement.  Recommended OV for further evaluation, patient states she has a interview in the morning that she is not willing to miss.  Patient is going to check for IUD string. Advised I will review with Dr. Quincy Simmonds and return call. Patient agreeable.   Dr. Quincy Simmonds -please review and advise?

## 2018-03-12 ENCOUNTER — Ambulatory Visit (INDEPENDENT_AMBULATORY_CARE_PROVIDER_SITE_OTHER): Payer: BLUE CROSS/BLUE SHIELD | Admitting: Obstetrics and Gynecology

## 2018-03-12 ENCOUNTER — Ambulatory Visit (INDEPENDENT_AMBULATORY_CARE_PROVIDER_SITE_OTHER): Payer: BLUE CROSS/BLUE SHIELD

## 2018-03-12 ENCOUNTER — Encounter: Payer: Self-pay | Admitting: Obstetrics and Gynecology

## 2018-03-12 ENCOUNTER — Other Ambulatory Visit: Payer: Self-pay

## 2018-03-12 VITALS — BP 98/54 | HR 72 | Resp 12 | Ht 64.5 in | Wt 147.0 lb

## 2018-03-12 DIAGNOSIS — Z30431 Encounter for routine checking of intrauterine contraceptive device: Secondary | ICD-10-CM

## 2018-03-12 DIAGNOSIS — R102 Pelvic and perineal pain: Secondary | ICD-10-CM

## 2018-03-12 NOTE — Progress Notes (Signed)
GYNECOLOGY  VISIT   HPI: 27 y.o.   Married  Caucasian  female   G0P0000 with Patient's last menstrual period was 02/23/2018.   here for   Pelvic ultrasound.  Motrin 800 mg every 8 hours and feeling better now.  Having pressure symptoms.   [name] IUD inserted on 02/27/18. Cramping for 3 days after insertion.  Then started to feel pain like she was sitting wrong.  Felt bloated.  Not comfortable with sleeping.  No fever, shakes or chills.   States pelvic US was tender in right adnexal region.  Recent discontinuation of OCPs.  GYNECOLOGIC HISTORY: Patient's last menstrual period was 02/23/2018. Contraception:  [name] IUD  Menopausal hormone therapy:  none Last mammogram:  none Last pap smear:   05/15/15 negative-- Care Everywhere        OB History    Gravida  0   Para  0   Term  0   Preterm  0   AB  0   Living  0     SAB  0   TAB  0   Ectopic  0   Multiple  0   Live Births  0              Patient Active Problem List   Diagnosis Date Noted  . BREAST MASS, LEFT 05/08/2009    Past Medical History:  Diagnosis Date  . Anxiety   . Fibroadenoma of both breasts     Past Surgical History:  Procedure Laterality Date  . WISDOM TOOTH EXTRACTION    . WISDOM TOOTH EXTRACTION      Current Outpatient Medications  Medication Sig Dispense Refill  . hydrOXYzine (ATARAX/VISTARIL) 10 MG tablet Take 10 mg by mouth 3 (three) times daily as needed.    . IBUPROFEN PO Take by mouth.    . Levonorgestrel ([name] IU) by Intrauterine route.    . Multiple Vitamins-Minerals (MULTIVITAMIN PO) Take by mouth.     No current facility-administered medications for this visit.      ALLERGIES: Patient has no known allergies.  Family History  Problem Relation Age of Onset  . Hypertension Mother   . Hypertension Father   . Diabetes Maternal Grandmother   . Hypertension Maternal Grandmother   . Hypertension Maternal Grandfather   . Hypertension Paternal  Grandmother   . Leukemia Paternal Grandfather   . Diabetes Paternal Grandfather   . Hypertension Paternal Grandfather     Social History   Socioeconomic History  . Marital status: Married    Spouse name: Not on file  . Number of children: Not on file  . Years of education: Not on file  . Highest education level: Not on file  Occupational History  . Not on file  Social Needs  . Financial resource strain: Not on file  . Food insecurity:    Worry: Not on file    Inability: Not on file  . Transportation needs:    Medical: Not on file    Non-medical: Not on file  Tobacco Use  . Smoking status: Never Smoker  . Smokeless tobacco: Never Used  Substance and Sexual Activity  . Alcohol use: Yes    Comment: social  . Drug use: No  . Sexual activity: Yes    Birth control/protection: IUD    Comment: kyleena placed 02-27-18  Lifestyle  . Physical activity:    Days per week: Not on file    Minutes per session: Not on file  .  Stress: Not on file  Relationships  . Social connections:    Talks on phone: Not on file    Gets together: Not on file    Attends religious service: Not on file    Active member of club or organization: Not on file    Attends meetings of clubs or organizations: Not on file    Relationship status: Not on file  . Intimate partner violence:    Fear of current or ex partner: Not on file    Emotionally abused: Not on file    Physically abused: Not on file    Forced sexual activity: Not on file  Other Topics Concern  . Not on file  Social History Narrative  . Not on file    Review of Systems  Constitutional: Negative.   HENT: Negative.   Eyes: Negative.   Respiratory: Negative.   Cardiovascular: Negative.   Gastrointestinal: Positive for abdominal distention.  Endocrine: Negative.   Genitourinary: Positive for pelvic pain.  Musculoskeletal: Negative.   Skin: Negative.   Allergic/Immunologic: Negative.   Neurological: Negative.   Hematological:  Negative.   Psychiatric/Behavioral: Negative.     PHYSICAL EXAMINATION:    BP (!) 98/54 (BP Location: Right Arm, Patient Position: Sitting, Cuff Size: Large)   Pulse 72   Resp 12   Ht 5' 4.5" (1.638 m)   Wt 147 lb (66.7 kg)   LMP 02/23/2018   BMI 24.84 kg/m     General appearance: alert, cooperative and appears stated age  Pelvic: External genitalia:  no lesions              Urethra:  normal appearing urethra with no masses, tenderness or lesions              Bartholins and Skenes: normal                 Vagina: normal appearing vagina with normal color and discharge, no lesions              Cervix: no lesions.. IUD strings noted. No CMT.                Bimanual Exam:  Uterus:  normal size, contour, position, consistency, mobility, non-tender              Adnexa: no mass, fullness, tenderness             Pelvic US Uterus no masses.  IUD in endometrial canal. EMS 1.90 mm. Right ovary with 17 mm follicle.  Left ovary normal. No free fluid.  Chaperone was present for exam.  ASSESSMENT  Pelvic cramping and bloating status post [name] IUD insertion.  Status post discontinuation of combined OCPs due to low libido.  PLAN  I suspect the cramping is due to placing the IUD and stopped her pills.  I have given her reassurance regarding the IUD position, lack of signs of infection, and the presence of normal findings on pelvic ultrasound.  She will monitor the cramping.  I will have her return in 2 weeks for a recheck.  We did discuss that some women are not able to use an IUD comfortably.  She may request to remove it at any time if she wishes.  Questions invited and answered.    An After Visit Summary was printed and given to the patient.  __15____ minutes face to face time of which over 50% was spent in counseling.

## 2018-03-30 DIAGNOSIS — F4323 Adjustment disorder with mixed anxiety and depressed mood: Secondary | ICD-10-CM | POA: Diagnosis not present

## 2018-04-03 ENCOUNTER — Encounter: Payer: Self-pay | Admitting: Obstetrics and Gynecology

## 2018-04-03 ENCOUNTER — Other Ambulatory Visit: Payer: Self-pay

## 2018-04-03 ENCOUNTER — Ambulatory Visit (INDEPENDENT_AMBULATORY_CARE_PROVIDER_SITE_OTHER): Payer: BLUE CROSS/BLUE SHIELD | Admitting: Obstetrics and Gynecology

## 2018-04-03 ENCOUNTER — Other Ambulatory Visit (HOSPITAL_COMMUNITY)
Admission: RE | Admit: 2018-04-03 | Discharge: 2018-04-03 | Disposition: A | Discharge status: Home / Self Care | Payer: BLUE CROSS/BLUE SHIELD | Source: Ambulatory Visit | Attending: Obstetrics and Gynecology | Admitting: Obstetrics and Gynecology

## 2018-04-03 VITALS — BP 100/56 | HR 64 | Resp 14 | Ht 64.5 in | Wt 142.0 lb

## 2018-04-03 DIAGNOSIS — Z3009 Encounter for other general counseling and advice on contraception: Secondary | ICD-10-CM | POA: Diagnosis not present

## 2018-04-03 DIAGNOSIS — R102 Pelvic and perineal pain: Secondary | ICD-10-CM | POA: Insufficient documentation

## 2018-04-03 NOTE — Progress Notes (Signed)
GYNECOLOGY  VISIT   HPI: 27 y.o.   Married  Caucasian  female   G0P0000 with Patient's last menstrual period was 03/27/2018.   here for IUD check    Had pelvic US confirming IUD in endometrial canal on 03/12/18.  This was done due to pain.   States her pain is still consistent.  When she her menses, she had pretty extreme pain.  Has pressure that comes and goes.   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  Taking ibuprofen every day.   Had sex for the first time since IUD placed.  No pain.  Previously she had decreased libido with use of combined OCPs. When she had a trial of stopping these in the past, she had her libido return.   GYNECOLOGIC HISTORY: Patient's last menstrual period was 03/27/2018. Contraception:  [name] IUD Menopausal hormone therapy:  none Last mammogram:  n/a Last pap smear:   05/15/15 negative-- Care Everywhere        OB History    Gravida  0   Para  0   Term  0   Preterm  0   AB  0   Living  0     SAB  0   TAB  0   Ectopic  0   Multiple  0   Live Births  0              Patient Active Problem List   Diagnosis Date Noted  . BREAST MASS, LEFT 05/08/2009    Past Medical History:  Diagnosis Date  . Anxiety   . Fibroadenoma of both breasts     Past Surgical History:  Procedure Laterality Date  . WISDOM TOOTH EXTRACTION    . WISDOM TOOTH EXTRACTION      Current Outpatient Medications  Medication Sig Dispense Refill  . hydrOXYzine (ATARAX/VISTARIL) 10 MG tablet Take 10 mg by mouth 3 (three) times daily as needed.    . IBUPROFEN PO Take by mouth.    . Levonorgestrel ([name] IU) by Intrauterine route.    . Multiple Vitamins-Minerals (MULTIVITAMIN PO) Take by mouth.     No current facility-administered medications for this visit.      ALLERGIES: Patient has no known allergies.  Family History  Problem Relation Age of Onset  . Hypertension Mother   . Hypertension Father   . Diabetes Maternal Grandmother   .  Hypertension Maternal Grandmother   . Hypertension Maternal Grandfather   . Hypertension Paternal Grandmother   . Leukemia Paternal Grandfather   . Diabetes Paternal Grandfather   . Hypertension Paternal Grandfather     Social History   Socioeconomic History  . Marital status: Married    Spouse name: Not on file  . Number of children: Not on file  . Years of education: Not on file  . Highest education level: Not on file  Occupational History  . Not on file  Social Needs  . Financial resource strain: Not on file  . Food insecurity:    Worry: Not on file    Inability: Not on file  . Transportation needs:    Medical: Not on file    Non-medical: Not on file  Tobacco Use  . Smoking status: Never Smoker  . Smokeless tobacco: Never Used  Substance and Sexual Activity  . Alcohol use: Yes    Comment: social  . Drug use: No  . Sexual activity: Yes    Birth control/protection: IUD    Comment: kyleena placed 02-27-18  Lifestyle  . Physical activity:    Days per week: Not on file    Minutes per session: Not on file  . Stress: Not on file  Relationships  . Social connections:    Talks on phone: Not on file    Gets together: Not on file    Attends religious service: Not on file    Active member of club or organization: Not on file    Attends meetings of clubs or organizations: Not on file    Relationship status: Not on file  . Intimate partner violence:    Fear of current or ex partner: Not on file    Emotionally abused: Not on file    Physically abused: Not on file    Forced sexual activity: Not on file  Other Topics Concern  . Not on file  Social History Narrative  . Not on file    Review of Systems  Constitutional: Negative.   HENT: Negative.   Eyes: Negative.   Respiratory: Negative.   Cardiovascular: Negative.   Gastrointestinal: Negative.   Endocrine: Negative.   Genitourinary:       Painful period  Musculoskeletal: Negative.   Skin: Negative.    Allergic/Immunologic: Negative.   Neurological: Negative.   Hematological: Negative.   Psychiatric/Behavioral: Negative.     PHYSICAL EXAMINATION:    BP (!) 100/56 (BP Location: Right Arm, Patient Position: Sitting, Cuff Size: Normal)   Pulse 64   Resp 14   Ht 5' 4.5" (1.638 m)   Wt 142 lb (64.4 kg)   LMP 03/27/2018   BMI 24.00 kg/m     General appearance: alert, cooperative and appears stated age   Pelvic: External genitalia:  no lesions              Urethra:  normal appearing urethra with no masses, tenderness or lesions              Bartholins and Skenes: normal                 Vagina: normal appearing vagina with normal color and discharge, no lesions              Cervix: no lesions.  IUD strings noted.                Bimanual Exam:  Uterus:  normal size, contour, position, consistency, mobility, non-tender              Adnexa: no mass, fullness, tenderness             Chaperone was present for exam.  ASSESSMENT  Pelvic pain status post IUD insertion. Normal placement on ultrasound.  Decreased libido on Apri OCPs.  PLAN  We discussed reasons for pelvic pain - IUD insertion, coming off OCPs, endometriosis, infectious disease.  Will check for GC/CT/trich today.  I discussed alternatives for her including barrier methods of contraception, Micronor, and LoLoestrin.  She will consider her options.   An After Visit Summary was printed and given to the patient.  __25____ minutes face to face time of which over 50% was spent in counseling.

## 2018-04-03 NOTE — Patient Instructions (Addendum)
Consider Micronor and LoLoestrin for contraceptive choices.  Norethindrone tablets (contraception) What is this medicine? NORETHINDRONE (nor eth IN drone) is an oral contraceptive. The product contains a female hormone known as a progestin. It is used to prevent pregnancy. This medicine may be used for other purposes; ask your health care provider or pharmacist if you have questions. COMMON BRAND NAME(S): Camila, Deblitane 28-Day, Errin, Heather, Sen[place], Jolivette, Mead, Nor-QD, Nora-BE, Norlyroc, Ortho Micronor, [employer] 28-Day What should I tell my health care provider before I take this medicine? They need to know if you have any of these conditions: -blood vessel disease or blood clots -breast, cervical, or vaginal cancer -diabetes -heart disease -kidney disease -liver disease -mental depression -migraine -seizures -stroke -vaginal bleeding -an unusual or allergic reaction to norethindrone, other medicines, foods, dyes, or preservatives -pregnant or trying to get pregnant -breast-feeding How should I use this medicine? Take this medicine by mouth with a glass of water. You may take it with or without food. Follow the directions on the prescription label. Take this medicine at the same time each day and in the order directed on the package. Do not take your medicine more often than directed. Contact your pediatrician regarding the use of this medicine in children. Special care may be needed. This medicine has been used in female children who have started having menstrual periods. A patient package insert for the product will be given with each prescription and refill. Read this sheet carefully each time. The sheet may change frequently. Overdosage: If you think you have taken too much of this medicine contact a poison control center or emergency room at once. NOTE: This medicine is only for you. Do not share this medicine with others. What if I miss a dose? Try not to miss a dose.  Every time you miss a dose or take a dose late your chance of pregnancy increases. When 1 pill is missed (even if only 3 hours late), take the missed pill as soon as possible and continue taking a pill each day at the regular time (use a back up method of birth control for the next 48 hours). If more than 1 dose is missed, use an additional birth control method for the rest of your pill pack until menses occurs. Contact your health care professional if more than 1 dose has been missed. What may interact with this medicine? Do not take this medicine with any of the following medications: -amprenavir or fosamprenavir -bosentan This medicine may also interact with the following medications: -antibiotics or medicines for infections, especially rifampin, rifabutin, rifapentine, and griseofulvin, and possibly penicillins or tetracyclines -aprepitant -barbiturate medicines, such as phenobarbital -carbamazepine -felbamate -modafinil -oxcarbazepine -phenytoin -ritonavir or other medicines for HIV infection or AIDS -St. John's wort -topiramate This list may not describe all possible interactions. Give your health care provider a list of all the medicines, herbs, non-prescription drugs, or dietary supplements you use. Also tell them if you smoke, drink alcohol, or use illegal drugs. Some items may interact with your medicine. What should I watch for while using this medicine? Visit your doctor or health care professional for regular checks on your progress. You will need a regular breast and pelvic exam and Pap smear while on this medicine. Use an additional method of birth control during the first cycle that you take these tablets. If you have any reason to think you are pregnant, stop taking this medicine right away and contact your doctor or health care professional. If you are  taking this medicine for hormone related problems, it may take several cycles of use to see improvement in your  condition. This medicine does not protect you against HIV infection (AIDS) or any other sexually transmitted diseases. What side effects may I notice from receiving this medicine? Side effects that you should report to your doctor or health care professional as soon as possible: -breast tenderness or discharge -pain in the abdomen, chest, groin or leg -severe headache -skin rash, itching, or hives -sudden shortness of breath -unusually weak or tired -vision or speech problems -yellowing of skin or eyes Side effects that usually do not require medical attention (report to your doctor or health care professional if they continue or are bothersome): -changes in sexual desire -change in menstrual flow -facial hair growth -fluid retention and swelling -headache -irritability -nausea -weight gain or loss This list may not describe all possible side effects. Call your doctor for medical advice about side effects. You may report side effects to FDA at 1-800-FDA-1088. Where should I keep my medicine? Keep out of the reach of children. Store at room temperature between 15 and 30 degrees C (59 and 86 degrees F). Throw away any unused medicine after the expiration date. NOTE: This sheet is a summary. It may not cover all possible information. If you have questions about this medicine, talk to your doctor, pharmacist, or health care provider.  2018 Elsevier/Gold Standard (2012-06-26 16:41:35)  Ethinyl Estradiol; Norethindrone Acetate; Ferrous fumarate tablets or capsules What is this medicine? ETHINYL ESTRADIOL; NORETHINDRONE ACETATE; FERROUS FUMARATE (ETH in il es tra DYE ole; nor eth IN drone AS e tate; FER [place] FUE ma rate) is an oral contraceptive. The products combine two types of female hormones, an estrogen and a progestin. They are used to prevent ovulation and pregnancy. Some products are also used to treat acne in females. This medicine may be used for other purposes; ask your health care  provider or pharmacist if you have questions. COMMON BRAND NAME(S): Blisovi [address], Blisovi Fe, Estrostep Fe, Gildess [address], Gildess Fe 1.5/30, Gildess Fe 1/20, Junel Fe 1.5/30, Junel Fe 1/20, Junel Fe 24, Larin Fe, Lo Loestrin Fe, Loestrin 24 Fe, Loestrin FE 1.5/30, Loestrin FE 1/20, Lomedia 24 Fe, Microgestin 24 Fe, Microgestin Fe 1.5/30, Microgestin Fe 1/20, Tarina Fe 1/20, Taytulla, Tilia Fe, Tri-Legest Fe What should I tell my health care provider before I take this medicine? They need to know if you have any of these conditions: -abnormal vaginal bleeding -blood vessel disease -breast, cervical, endometrial, ovarian, liver, or uterine cancer -diabetes -gallbladder disease -heart disease or recent heart attack -high blood pressure -high cholesterol -history of blood clots -kidney disease -liver disease -migraine headaches -smoke tobacco -stroke -systemic lupus erythematosus (SLE) -an unusual or allergic reaction to estrogens, progestins, other medicines, foods, dyes, or preservatives -pregnant or trying to get pregnant -breast-feeding How should I use this medicine? Take this medicine by mouth. To reduce nausea, this medicine may be taken with food. Follow the directions on the prescription label. Take this medicine at the same time each day and in the order directed on the package. Do not take your medicine more often than directed. A patient package insert for the product will be given with each prescription and refill. Read this sheet carefully each time. The sheet may change frequently. Contact your pediatrician regarding the use of this medicine in children. Special care may be needed. This medicine has been used in female children who have started having menstrual periods.  Overdosage: If you think you have taken too much of this medicine contact a poison control center or emergency room at once. NOTE: This medicine is only for you. Do not share this medicine with others. What if  I miss a dose? If you miss a dose, refer to the patient information sheet you received with your medicine for direction. If you miss more than one pill, this medicine may not be as effective and you may need to use another form of birth control. What may interact with this medicine? Do not take this medicine with the following medication: -dasabuvir; ombitasvir; paritaprevir; ritonavir -ombitasvir; paritaprevir; ritonavir This medicine may also interact with the following medications: -acetaminophen -antibiotics or medicines for infections, especially rifampin, rifabutin, rifapentine, and griseofulvin, and possibly penicillins or tetracyclines -aprepitant -ascorbic acid (vitamin C) -atorvastatin -barbiturate medicines, such as phenobarbital -bosentan -carbamazepine -caffeine -clofibrate -cyclosporine -dantrolene -doxercalciferol -felbamate -grapefruit juice -hydrocortisone -medicines for anxiety or sleeping problems, such as diazepam or temazepam -medicines for diabetes, including pioglitazone -mineral oil -modafinil -mycophenolate -nefazodone -oxcarbazepine -phenytoin -prednisolone -ritonavir or other medicines for HIV infection or AIDS -rosuvastatin -selegiline -soy isoflavones supplements -St. John's wort -tamoxifen or raloxifene -theophylline -thyroid hormones -topiramate -warfarin This list may not describe all possible interactions. Give your health care provider a list of all the medicines, herbs, non-prescription drugs, or dietary supplements you use. Also tell them if you smoke, drink alcohol, or use illegal drugs. Some items may interact with your medicine. What should I watch for while using this medicine? Visit your doctor or health care professional for regular checks on your progress. You will need a regular breast and pelvic exam and Pap smear while on this medicine. Use an additional method of contraception during the first cycle that you take these  tablets. If you have any reason to think you are pregnant, stop taking this medicine right away and contact your doctor or health care professional. If you are taking this medicine for hormone related problems, it may take several cycles of use to see improvement in your condition. Smoking increases the risk of getting a blood clot or having a stroke while you are taking birth control pills, especially if you are more than 25 years old. You are strongly advised not to smoke. This medicine can make your body retain fluid, making your fingers, hands, or ankles swell. Your blood pressure can go up. Contact your doctor or health care professional if you feel you are retaining fluid. This medicine can make you more sensitive to the sun. Keep out of the sun. If you cannot avoid being in the sun, wear protective clothing and use sunscreen. Do not use sun lamps or tanning beds/booths. If you wear contact lenses and notice visual changes, or if the lenses begin to feel uncomfortable, consult your eye care specialist. In some women, tenderness, swelling, or minor bleeding of the gums may occur. Notify your dentist if this happens. Brushing and flossing your teeth regularly may help limit this. See your dentist regularly and inform your dentist of the medicines you are taking. If you are going to have elective surgery, you may need to stop taking this medicine before the surgery. Consult your health care professional for advice. This medicine does not protect you against HIV infection (AIDS) or any other sexually transmitted diseases. What side effects may I notice from receiving this medicine? Side effects that you should report to your doctor or health care professional as soon as possible: -allergic reactions like skin rash, itching  or hives, swelling of the face, lips, or tongue -breast tissue changes or discharge -changes in vaginal bleeding during your period or between your periods -changes in  vision -chest pain -confusion -coughing up blood -dizziness -feeling faint or lightheaded -headaches or migraines -leg, arm or groin pain -loss of balance or coordination -severe or sudden headaches -stomach pain (severe) -sudden shortness of breath -sudden numbness or weakness of the face, arm or leg -symptoms of vaginal infection like itching, irritation or unusual discharge -tenderness in the upper abdomen -trouble speaking or understanding -vomiting -yellowing of the eyes or skin Side effects that usually do not require medical attention (report to your doctor or health care professional if they continue or are bothersome): -breakthrough bleeding and spotting that continues beyond the 3 initial cycles of pills -breast tenderness -mood changes, anxiety, depression, frustration, anger, or emotional outbursts -increased sensitivity to sun or ultraviolet light -nausea -skin rash, acne, or brown spots on the skin -weight gain (slight) This list may not describe all possible side effects. Call your doctor for medical advice about side effects. You may report side effects to FDA at 1-800-FDA-1088. Where should I keep my medicine? Keep out of the reach of children. Store at room temperature between 15 and 30 degrees C (59 and 86 degrees F). Throw away any unused medicine after the expiration date. NOTE: This sheet is a summary. It may not cover all possible information. If you have questions about this medicine, talk to your doctor, pharmacist, or health care provider.  2018 Elsevier/Gold Standard (2016-06-17 08:04:41)   Contraception Choices Contraception, also called birth control, refers to methods or devices that prevent pregnancy. Hormonal methods Contraceptive implant A contraceptive implant is a thin, plastic tube that contains a hormone. It is inserted into the upper part of the arm. It can remain in place for up to 3 years. Progestin-only injections Progestin-only  injections are injections of progestin, a synthetic form of the hormone progesterone. They are given every 3 months by a health care provider. Birth control pills Birth control pills are pills that contain hormones that prevent pregnancy. They must be taken once a day, preferably at the same time each day. Birth control patch The birth control patch contains hormones that prevent pregnancy. It is placed on the skin and must be changed once a week for three weeks and removed on the fourth week. A prescription is needed to use this method of contraception. Vaginal ring A vaginal ring contains hormones that prevent pregnancy. It is placed in the vagina for three weeks and removed on the fourth week. After that, the process is repeated with a new ring. A prescription is needed to use this method of contraception. Emergency contraceptive Emergency contraceptives prevent pregnancy after unprotected sex. They come in pill form and can be taken up to 5 days after sex. They work best the sooner they are taken after having sex. Most emergency contraceptives are available without a prescription. This method should not be used as your only form of birth control. Barrier methods Female condom A female condom is a thin sheath that is worn over the penis during sex. Condoms keep sperm from going inside a woman's body. They can be used with a spermicide to increase their effectiveness. They should be disposed after a single use. Female condom A female condom is a soft, loose-fitting sheath that is put into the vagina before sex. The condom keeps sperm from going inside a woman's body. They should be disposed after a  single use. Diaphragm A diaphragm is a soft, dome-shaped barrier. It is inserted into the vagina before sex, along with a spermicide. The diaphragm blocks sperm from entering the uterus, and the spermicide kills sperm. A diaphragm should be left in the vagina for 6-8 hours after sex and removed within 24  hours. A diaphragm is prescribed and fitted by a health care provider. A diaphragm should be replaced every 1-2 years, after giving birth, after gaining more than 15 lb (6.8 kg), and after pelvic surgery. Cervical cap A cervical cap is a round, soft latex or plastic cup that fits over the cervix. It is inserted into the vagina before sex, along with spermicide. It blocks sperm from entering the uterus. The cap should be left in place for 6-8 hours after sex and removed within 48 hours. A cervical cap must be prescribed and fitted by a health care provider. It should be replaced every 2 years. Sponge A sponge is a soft, circular piece of polyurethane foam with spermicide on it. The sponge helps block sperm from entering the uterus, and the spermicide kills sperm. To use it, you make it wet and then insert it into the vagina. It should be inserted before sex, left in for at least 6 hours after sex, and removed and thrown away within 30 hours. Spermicides Spermicides are chemicals that kill or block sperm from entering the cervix and uterus. They can come as a cream, jelly, suppository, foam, or tablet. A spermicide should be inserted into the vagina with an applicator at least 54-00 minutes before sex to allow time for it to work. The process must be repeated every time you have sex. Spermicides do not require a prescription. Intrauterine contraception Intrauterine device (IUD) An IUD is a T-shaped device that is put in a woman's uterus. There are two types:  Hormone IUD.This type contains progestin, a synthetic form of the hormone progesterone. This type can stay in place for 3-5 years.  Copper IUD.This type is wrapped in copper wire. It can stay in place for 10 years.  Permanent methods of contraception Female tubal ligation In this method, a woman's fallopian tubes are sealed, tied, or blocked during surgery to prevent eggs from traveling to the uterus. Hysteroscopic sterilization In this  method, a small, flexible insert is placed into each fallopian tube. The inserts cause scar tissue to form in the fallopian tubes and block them, so sperm cannot reach an egg. The procedure takes about 3 months to be effective. Another form of birth control must be used during those 3 months. Female sterilization This is a procedure to tie off the tubes that carry sperm (vasectomy). After the procedure, the man can still ejaculate fluid (semen). Natural planning methods Natural family planning In this method, a couple does not have sex on days when the woman could become pregnant. Calendar method This means keeping track of the length of each menstrual cycle, identifying the days when pregnancy can happen, and not having sex on those days. Ovulation method In this method, a couple avoids sex during ovulation. Symptothermal method This method involves not having sex during ovulation. The woman typically checks for ovulation by watching changes in her temperature and in the consistency of cervical mucus. Post-ovulation method In this method, a couple waits to have sex until after ovulation. Summary  Contraception, also called birth control, means methods or devices that prevent pregnancy.  Hormonal methods of contraception include implants, injections, pills, patches, vaginal rings, and emergency contraceptives.  Barrier methods of contraception can include female condoms, female condoms, diaphragms, cervical caps, sponges, and spermicides.  There are two types of IUDs (intrauterine devices). An IUD can be put in a woman's uterus to prevent pregnancy for 3-5 years.  Permanent sterilization can be done through a procedure for males, females, or both.  Natural family planning methods involve not having sex on days when the woman could become pregnant. This information is not intended to replace advice given to you by your health care provider. Make sure you discuss any questions you have with your  health care provider. Document Released: 10/07/2005 Document Revised: 11/09/2016 Document Reviewed: 11/09/2016 Elsevier Interactive Patient Education  2018 [employer].

## 2018-04-06 LAB — CERVICOVAGINAL ANCILLARY ONLY
CHLAMYDIA, DNA PROBE: NEGATIVE
Neisseria Gonorrhea: NEGATIVE
TRICH (WINDOWPATH): NEGATIVE

## 2018-04-20 DIAGNOSIS — F4323 Adjustment disorder with mixed anxiety and depressed mood: Secondary | ICD-10-CM | POA: Diagnosis not present

## 2018-05-05 DIAGNOSIS — F4323 Adjustment disorder with mixed anxiety and depressed mood: Secondary | ICD-10-CM | POA: Diagnosis not present

## 2018-05-18 DIAGNOSIS — F4323 Adjustment disorder with mixed anxiety and depressed mood: Secondary | ICD-10-CM | POA: Diagnosis not present

## 2018-05-23 DIAGNOSIS — F4323 Adjustment disorder with mixed anxiety and depressed mood: Secondary | ICD-10-CM | POA: Diagnosis not present

## 2018-06-03 DIAGNOSIS — F4323 Adjustment disorder with mixed anxiety and depressed mood: Secondary | ICD-10-CM | POA: Diagnosis not present

## 2018-06-13 DIAGNOSIS — F4323 Adjustment disorder with mixed anxiety and depressed mood: Secondary | ICD-10-CM | POA: Diagnosis not present

## 2018-06-24 DIAGNOSIS — F4323 Adjustment disorder with mixed anxiety and depressed mood: Secondary | ICD-10-CM | POA: Diagnosis not present

## 2018-06-25 ENCOUNTER — Ambulatory Visit (HOSPITAL_COMMUNITY)
Admission: RE | Admit: 2018-06-25 | Discharge: 2018-06-25 | Disposition: A | Discharge status: Home / Self Care | Payer: BLUE CROSS/BLUE SHIELD | Source: Ambulatory Visit | Attending: Certified Nurse Midwife | Admitting: Certified Nurse Midwife

## 2018-06-25 ENCOUNTER — Other Ambulatory Visit (HOSPITAL_COMMUNITY): Payer: Self-pay | Admitting: Certified Nurse Midwife

## 2018-06-25 DIAGNOSIS — N9489 Other specified conditions associated with female genital organs and menstrual cycle: Secondary | ICD-10-CM | POA: Diagnosis not present

## 2018-06-25 DIAGNOSIS — Z975 Presence of (intrauterine) contraceptive device: Secondary | ICD-10-CM | POA: Insufficient documentation

## 2018-06-25 DIAGNOSIS — R102 Pelvic and perineal pain: Secondary | ICD-10-CM | POA: Insufficient documentation

## 2018-07-21 DIAGNOSIS — E282 Polycystic ovarian syndrome: Secondary | ICD-10-CM | POA: Insufficient documentation

## 2018-08-13 DIAGNOSIS — F4323 Adjustment disorder with mixed anxiety and depressed mood: Secondary | ICD-10-CM | POA: Diagnosis not present

## 2018-08-21 DIAGNOSIS — F4323 Adjustment disorder with mixed anxiety and depressed mood: Secondary | ICD-10-CM | POA: Diagnosis not present

## 2018-09-03 DIAGNOSIS — F4323 Adjustment disorder with mixed anxiety and depressed mood: Secondary | ICD-10-CM | POA: Diagnosis not present

## 2018-10-01 DIAGNOSIS — F4323 Adjustment disorder with mixed anxiety and depressed mood: Secondary | ICD-10-CM | POA: Diagnosis not present

## 2018-10-08 DIAGNOSIS — F4323 Adjustment disorder with mixed anxiety and depressed mood: Secondary | ICD-10-CM | POA: Diagnosis not present

## 2018-11-09 LAB — HM PAP SMEAR: HM Pap smear: NORMAL

## 2018-11-17 ENCOUNTER — Ambulatory Visit (HOSPITAL_COMMUNITY)
Admission: EM | Admit: 2018-11-17 | Discharge: 2018-11-17 | Disposition: A | Discharge status: Home / Self Care | Payer: 59 | Attending: Emergency Medicine | Admitting: Emergency Medicine

## 2018-11-17 ENCOUNTER — Encounter (HOSPITAL_COMMUNITY): Payer: Self-pay

## 2018-11-17 DIAGNOSIS — J069 Acute upper respiratory infection, unspecified: Secondary | ICD-10-CM | POA: Diagnosis not present

## 2018-11-17 DIAGNOSIS — B9789 Other viral agents as the cause of diseases classified elsewhere: Secondary | ICD-10-CM | POA: Insufficient documentation

## 2018-11-17 DIAGNOSIS — J029 Acute pharyngitis, unspecified: Secondary | ICD-10-CM | POA: Diagnosis present

## 2018-11-17 LAB — POCT RAPID STREP A: STREPTOCOCCUS, GROUP A SCREEN (DIRECT): NEGATIVE

## 2018-11-17 MED ORDER — AZITHROMYCIN 250 MG PO TABS: 250.0000 mg | ORAL_TABLET | Freq: Every day | ORAL | 0 refills | Status: DC

## 2018-11-17 MED ORDER — PREDNISONE 10 MG (21) PO TBPK: ORAL_TABLET | Freq: Every day | ORAL | 0 refills | Status: DC

## 2018-11-17 NOTE — ED Triage Notes (Signed)
Pt present sore throat, sinus issues and coughing . Symptoms started 3 days ago.  Pt states that her ears are itching

## 2018-11-17 NOTE — Discharge Instructions (Signed)
It does not appear that you have strep at this time .

## 2018-11-17 NOTE — ED Provider Notes (Signed)
[place]    CSN: [id number] Arrival date & time: 11/17/18  1025     History   Chief Complaint Chief Complaint  Patient presents with  . Sore Throat  . Sinus Problem    HPI Darlene Wright is a 28 y.o. female.   Cough congestion, sore throat, generalized not feeling well. Taking OTC sinus meds with no relief. States that she has chronic sinus infections. Denies any n/v/d      Past Medical History:  Diagnosis Date  . Anxiety   . Fibroadenoma of both breasts     Patient Active Problem List   Diagnosis Date Noted  . BREAST MASS, LEFT 05/08/2009    Past Surgical History:  Procedure Laterality Date  . WISDOM TOOTH EXTRACTION    . WISDOM TOOTH EXTRACTION      OB History    Gravida  0   Para  0   Term  0   Preterm  0   AB  0   Living  0     SAB  0   TAB  0   Ectopic  0   Multiple  0   Live Births  0            Home Medications    Prior to Admission medications   Medication Sig Start Date End Date Taking? Authorizing Provider  azithromycin (ZITHROMAX) 250 MG tablet Take 1 tablet (250 mg total) by mouth daily. Take first 2 tablets together, then 1 every day until finished. 11/17/18   Marney Setting, NP  hydrOXYzine (ATARAX/VISTARIL) 10 MG tablet Take 10 mg by mouth 3 (three) times daily as needed.    [provider]  IBUPROFEN PO Take by mouth.    [provider]  Levonorgestrel ([name] IU) by Intrauterine route.    [provider]  Multiple Vitamins-Minerals (MULTIVITAMIN PO) Take by mouth.    [provider]  predniSONE (STERAPRED UNI-PAK 21 TAB) 10 MG (21) TBPK tablet Take by mouth daily. Take 6 tabs by mouth daily  for 2 days, then 5 tabs for 2 days, then 4 tabs for 2 days, then 3 tabs for 2 days, 2 tabs for 2 days, then 1 tab by mouth daily for 2 days 11/17/18   Marney Setting, NP    Family History Family History  Problem Relation Age of Onset  . Hypertension Mother   .  Hypertension Father   . Diabetes Maternal Grandmother   . Hypertension Maternal Grandmother   . Hypertension Maternal Grandfather   . Hypertension Paternal Grandmother   . Leukemia Paternal Grandfather   . Diabetes Paternal Grandfather   . Hypertension Paternal Grandfather     Social History Social History   Tobacco Use  . Smoking status: Never Smoker  . Smokeless tobacco: Never Used  Substance Use Topics  . Alcohol use: Yes    Comment: social  . Drug use: No     Allergies   Patient has no known allergies.   Review of Systems Review of Systems  Constitutional: Negative.   HENT: Positive for congestion, postnasal drip, rhinorrhea, sinus pressure, sinus pain and sore throat.   Eyes: Negative.   Respiratory: Positive for cough.   Cardiovascular: Negative.   Gastrointestinal: Negative.   Musculoskeletal: Negative.   Skin: Negative.   Neurological: Negative.      Physical Exam Triage Vital Signs ED Triage Vitals [11/17/18 1039]  Enc Vitals Group     BP 117/69  Pulse Rate 69     Resp 18     Temp 98.4 F (36.9 C)     Temp Source Oral     SpO2 99 %     Weight      Height      Head Circumference      Peak Flow      Pain Score 3     Pain Loc      Pain Edu?      Excl. in [place]?    No data found.  Updated Vital Signs BP 117/69 (BP Location: Right Arm)   Pulse 69   Temp 98.4 F (36.9 C) (Oral)   Resp 18   LMP 11/02/2018   SpO2 99%   Visual Acuity Right Eye Distance:   Left Eye Distance:   Bilateral Distance:    Right Eye Near:   Left Eye Near:    Bilateral Near:     Physical Exam HENT:     Right Ear: Tympanic membrane normal.     Left Ear: Tympanic membrane normal.     Nose: Congestion and rhinorrhea present.     Mouth/Throat:     Mouth: Mucous membranes are moist.     Pharynx: Posterior oropharyngeal erythema present.     Tonsils: Swelling: 0 on the right. 0 on the left.  Eyes:     Conjunctiva/sclera: Conjunctivae normal.  Neck:      Musculoskeletal: Normal range of motion.  Cardiovascular:     Rate and Rhythm: Normal rate.  Pulmonary:     Effort: Pulmonary effort is normal.  Abdominal:     Palpations: Abdomen is soft.  Skin:    General: Skin is warm.  Neurological:     General: No focal deficit present.     Mental Status: She is alert.      UC Treatments / Results  Labs (all labs ordered are listed, but only abnormal results are displayed) Labs Reviewed - No data to display  EKG None  Radiology No results found.  Procedures Procedures (including critical care time)  Medications Ordered in UC Medications - No data to display  Initial Impression / Assessment and Plan / UC Course  I have reviewed the triage vital signs and the nursing notes.  Pertinent labs & imaging results that were available during my care of the patient were reviewed by me and considered in my medical decision making (see chart for details).     May take tylenol or motrin as needed for the pain  You currently do not have a fever or bacterial type of infection will give abx to only take if not better in 2 days  Stay hydrated   Final Clinical Impressions(s) / UC Diagnoses   Final diagnoses:  Acute pharyngitis, unspecified etiology  Viral URI with cough   Discharge Instructions   None    ED Prescriptions    Medication Sig Dispense Auth. Provider   predniSONE (STERAPRED UNI-PAK 21 TAB) 10 MG (21) TBPK tablet Take by mouth daily. Take 6 tabs by mouth daily  for 2 days, then 5 tabs for 2 days, then 4 tabs for 2 days, then 3 tabs for 2 days, 2 tabs for 2 days, then 1 tab by mouth daily for 2 days 42 tablet Morley Kos L, NP   azithromycin (ZITHROMAX) 250 MG tablet Take 1 tablet (250 mg total) by mouth daily. Take first 2 tablets together, then 1 every day until finished. 6 tablet Marney Setting, NP  Controlled Substance Prescriptions  Controlled Substance Registry consulted? Not Applicable   Marney Setting, NP 11/17/18 1100

## 2018-11-19 LAB — CULTURE, GROUP A STREP (THRC)

## 2019-03-29 DIAGNOSIS — Z01419 Encounter for gynecological examination (general) (routine) without abnormal findings: Secondary | ICD-10-CM | POA: Diagnosis not present

## 2019-04-28 DIAGNOSIS — F4323 Adjustment disorder with mixed anxiety and depressed mood: Secondary | ICD-10-CM | POA: Diagnosis not present

## 2019-05-12 DIAGNOSIS — F4323 Adjustment disorder with mixed anxiety and depressed mood: Secondary | ICD-10-CM | POA: Diagnosis not present

## 2019-05-28 DIAGNOSIS — Z20828 Contact with and (suspected) exposure to other viral communicable diseases: Secondary | ICD-10-CM | POA: Diagnosis not present

## 2019-05-28 DIAGNOSIS — F4310 Post-traumatic stress disorder, unspecified: Secondary | ICD-10-CM | POA: Diagnosis not present

## 2019-06-02 DIAGNOSIS — F4310 Post-traumatic stress disorder, unspecified: Secondary | ICD-10-CM | POA: Diagnosis not present

## 2019-06-11 DIAGNOSIS — F4310 Post-traumatic stress disorder, unspecified: Secondary | ICD-10-CM | POA: Diagnosis not present

## 2019-06-14 ENCOUNTER — Encounter (HOSPITAL_COMMUNITY): Payer: Self-pay | Admitting: Emergency Medicine

## 2019-06-14 ENCOUNTER — Emergency Department (HOSPITAL_COMMUNITY): Payer: BLUE CROSS/BLUE SHIELD

## 2019-06-14 ENCOUNTER — Emergency Department (HOSPITAL_COMMUNITY)
Admission: EM | Admit: 2019-06-14 | Discharge: 2019-06-14 | Disposition: A | Discharge status: Home / Self Care | Payer: BLUE CROSS/BLUE SHIELD | Attending: Emergency Medicine | Admitting: Emergency Medicine

## 2019-06-14 ENCOUNTER — Other Ambulatory Visit: Payer: Self-pay

## 2019-06-14 DIAGNOSIS — Z79899 Other long term (current) drug therapy: Secondary | ICD-10-CM | POA: Insufficient documentation

## 2019-06-14 DIAGNOSIS — R05 Cough: Secondary | ICD-10-CM | POA: Insufficient documentation

## 2019-06-14 DIAGNOSIS — R0789 Other chest pain: Secondary | ICD-10-CM | POA: Insufficient documentation

## 2019-06-14 DIAGNOSIS — R079 Chest pain, unspecified: Secondary | ICD-10-CM | POA: Diagnosis not present

## 2019-06-14 DIAGNOSIS — R059 Cough, unspecified: Secondary | ICD-10-CM

## 2019-06-14 LAB — CBC
HCT: 41.3 % (ref 36.0–46.0)
Hemoglobin: 13.8 g/dL (ref 12.0–15.0)
MCH: 29.3 pg (ref 26.0–34.0)
MCHC: 33.4 g/dL (ref 30.0–36.0)
MCV: 87.7 fL (ref 80.0–100.0)
Platelets: 256 10*3/uL (ref 150–400)
RBC: 4.71 MIL/uL (ref 3.87–5.11)
RDW: 11.9 % (ref 11.5–15.5)
WBC: 6.7 10*3/uL (ref 4.0–10.5)
nRBC: 0 % (ref 0.0–0.2)

## 2019-06-14 LAB — I-STAT BETA HCG BLOOD, ED (MC, WL, AP ONLY): I-stat hCG, quantitative: <5 m[IU]/mL (ref <5)

## 2019-06-14 LAB — BASIC METABOLIC PANEL WITH GFR
Anion gap: 10 (ref 5–15)
BUN: 9 mg/dL (ref 6–20)
CO2: 21 mmol/L — ABNORMAL LOW (ref 22–32)
Calcium: 8.8 mg/dL — ABNORMAL LOW (ref 8.9–10.3)
Chloride: 108 mmol/L (ref 98–111)
Creatinine, Ser: 0.81 mg/dL (ref 0.44–1.00)
GFR calc Af Amer: >60 mL/min
GFR calc non Af Amer: >60 mL/min
Glucose, Bld: 93 mg/dL (ref 70–99)
Potassium: 3.8 mmol/L (ref 3.5–5.1)
Sodium: 139 mmol/L (ref 135–145)

## 2019-06-14 MED ORDER — BENZONATATE 100 MG PO CAPS: 100.0000 mg | ORAL_CAPSULE | Freq: Three times a day (TID) | ORAL | 0 refills | Status: DC

## 2019-06-14 MED ORDER — SODIUM CHLORIDE 0.9% FLUSH: 3.0000 mL | Freq: Once | INTRAVENOUS | Status: DC

## 2019-06-14 NOTE — ED Provider Notes (Signed)
[place] EMERGENCY DEPARTMENT Provider Note   CSN: [id number] Arrival date & time: 06/14/19  1137     History   Chief Complaint Chief Complaint  Patient presents with  . Chest Pain  . Cough    HPI Darlene Wright is a 28 y.o. female.     HPI    28 year old female presents today with complaints of cough.  Patient notes approximately 4 weeks ago she developed body aches and cough.  The body aches went away but she has had this nonproductive intermittent cough since that time.  Patient denies any associated shortness of breath or fever, she notes her significant other had similar symptoms initially.  She reports yesterday she had one episode of right-sided chest pain that went away.  She denies any close sick contacts, has had covert testing after symptoms started that were negative.  She has no chronic health conditions and does not smoke.  She notes that the cough is slightly worse in the evenings.   Past Medical History:  Diagnosis Date  . Anxiety   . Fibroadenoma of both breasts     Patient Active Problem List   Diagnosis Date Noted  . BREAST MASS, LEFT 05/08/2009    Past Surgical History:  Procedure Laterality Date  . WISDOM TOOTH EXTRACTION    . WISDOM TOOTH EXTRACTION       OB History    Gravida  0   Para  0   Term  0   Preterm  0   AB  0   Living  0     SAB  0   TAB  0   Ectopic  0   Multiple  0   Live Births  0            Home Medications    Prior to Admission medications   Medication Sig Start Date End Date Taking? Authorizing Provider  azithromycin (ZITHROMAX) 250 MG tablet Take 1 tablet (250 mg total) by mouth daily. Take first 2 tablets together, then 1 every day until finished. 11/17/18   Marney Setting, NP  benzonatate (TESSALON) 100 MG capsule Take 1 capsule (100 mg total) by mouth every 8 (eight) hours. 06/14/19   Alim Cattell, Dellis Filbert, PA-C  hydrOXYzine (ATARAX/VISTARIL) 10 MG tablet Take 10 mg by mouth 3  (three) times daily as needed.    [provider]  IBUPROFEN PO Take by mouth.    [provider]  Levonorgestrel ([name] IU) by Intrauterine route.    [provider]  Multiple Vitamins-Minerals (MULTIVITAMIN PO) Take by mouth.    [provider]  predniSONE (STERAPRED UNI-PAK 21 TAB) 10 MG (21) TBPK tablet Take by mouth daily. Take 6 tabs by mouth daily  for 2 days, then 5 tabs for 2 days, then 4 tabs for 2 days, then 3 tabs for 2 days, 2 tabs for 2 days, then 1 tab by mouth daily for 2 days 11/17/18   Marney Setting, NP    Family History Family History  Problem Relation Age of Onset  . Hypertension Mother   . Hypertension Father   . Diabetes Maternal Grandmother   . Hypertension Maternal Grandmother   . Hypertension Maternal Grandfather   . Hypertension Paternal Grandmother   . Leukemia Paternal Grandfather   . Diabetes Paternal Grandfather   . Hypertension Paternal Grandfather     Social History Social History   Tobacco Use  . Smoking status: Never Smoker  . Smokeless tobacco:  Never Used  Substance Use Topics  . Alcohol use: Yes    Comment: social  . Drug use: No     Allergies   Patient has no known allergies.   Review of Systems Review of Systems  All other systems reviewed and are negative.    Physical Exam Updated Vital Signs BP (!) 123/97 (BP Location: Right Arm)   Pulse 76   Temp 98.8 F (37.1 C) (Oral)   Resp 17   SpO2 100%   Physical Exam Vitals signs and nursing note reviewed.  Constitutional:      Appearance: She is well-developed.  HENT:     Head: Normocephalic and atraumatic.  Eyes:     General: No scleral icterus.       Right eye: No discharge.        Left eye: No discharge.     Conjunctiva/sclera: Conjunctivae normal.     Pupils: Pupils are equal, round, and reactive to light.  Neck:     Musculoskeletal: Normal range of motion.     Vascular: No JVD.     Trachea: No tracheal deviation.   Pulmonary:     Effort: Pulmonary effort is normal. No respiratory distress.     Breath sounds: Normal breath sounds. No stridor. No wheezing or rales.  Neurological:     Mental Status: She is alert and oriented to person, place, and time.     Coordination: Coordination normal.  Psychiatric:        Behavior: Behavior normal.        Thought Content: Thought content normal.        Judgment: Judgment normal.      ED Treatments / Results  Labs (all labs ordered are listed, but only abnormal results are displayed) Labs Reviewed  BASIC METABOLIC PANEL - Abnormal; Notable for the following components:      Result Value   CO2 21 (*)    Calcium 8.8 (*)    All other components within normal limits  CBC  I-STAT BETA HCG BLOOD, ED ([facility], [facility], [facility] ONLY)    EKG None  Radiology Dg Chest 2 View  Result Date: 06/14/2019 CLINICAL DATA:  Cough, chest pain EXAM: CHEST - 2 VIEW COMPARISON:  10/19/2016 FINDINGS: The heart size and mediastinal contours are within normal limits. Both lungs are clear. The visualized skeletal structures are unremarkable. IMPRESSION: No acute abnormality of the lungs. Electronically Signed   By: Eddie Candle M.D.   On: 06/14/2019 12:15    Procedures Procedures (including critical care time)  Medications Ordered in ED Medications  sodium chloride flush (NS) 0.9 % injection 3 mL (has no administration in time range)     Initial Impression / Assessment and Plan / ED Course  I have reviewed the triage vital signs and the nursing notes.  Pertinent labs & imaging results that were available during my care of the patient were reviewed by me and considered in my medical decision making (see chart for details).        28 year old female presents today with cough.  This is likely post viral, she has no signs of active infection.  Patient discharged with cough medication strict return precautions and follow-up information.  She verbalized understanding and agreement to  today's plan had no further questions or concerns at time of discharge.  Final Clinical Impressions(s) / ED Diagnoses   Final diagnoses:  Cough    ED Discharge Orders         Ordered  benzonatate (TESSALON) 100 MG capsule  Every 8 hours     06/14/19 1354           Okey Regal, PA-C 06/14/19 1354    Quintella Reichert, MD 06/15/19 [id number]

## 2019-06-14 NOTE — Discharge Instructions (Addendum)
Please read attached information. If you experience any new or worsening signs or symptoms please return to the emergency room for evaluation. Please follow-up with your primary care provider or specialist as discussed. Please use medication prescribed only as directed and discontinue taking if you have any concerning signs or symptoms.   °

## 2019-06-14 NOTE — ED Triage Notes (Signed)
Pt reports right side chest pain non radiating since last night. A non productive cough and denies shortness of breath or fevers.

## 2019-06-16 DIAGNOSIS — F4310 Post-traumatic stress disorder, unspecified: Secondary | ICD-10-CM | POA: Diagnosis not present

## 2019-06-30 ENCOUNTER — Encounter (HOSPITAL_COMMUNITY): Payer: Self-pay

## 2019-06-30 ENCOUNTER — Other Ambulatory Visit: Payer: Self-pay

## 2019-06-30 ENCOUNTER — Ambulatory Visit (HOSPITAL_COMMUNITY)
Admission: EM | Admit: 2019-06-30 | Discharge: 2019-06-30 | Disposition: A | Discharge status: Home / Self Care | Payer: BLUE CROSS/BLUE SHIELD | Attending: Family Medicine | Admitting: Family Medicine

## 2019-06-30 DIAGNOSIS — J22 Unspecified acute lower respiratory infection: Secondary | ICD-10-CM

## 2019-06-30 DIAGNOSIS — R059 Cough, unspecified: Secondary | ICD-10-CM

## 2019-06-30 DIAGNOSIS — Z20828 Contact with and (suspected) exposure to other viral communicable diseases: Secondary | ICD-10-CM | POA: Diagnosis not present

## 2019-06-30 DIAGNOSIS — R05 Cough: Secondary | ICD-10-CM | POA: Diagnosis not present

## 2019-06-30 DIAGNOSIS — F4310 Post-traumatic stress disorder, unspecified: Secondary | ICD-10-CM | POA: Diagnosis not present

## 2019-06-30 MED ORDER — CETIRIZINE HCL 10 MG PO CAPS: 10.0000 mg | ORAL_CAPSULE | Freq: Every day | ORAL | 0 refills | Status: DC

## 2019-06-30 MED ORDER — ALBUTEROL SULFATE HFA 108 (90 BASE) MCG/ACT IN AERS: 1.0000 | INHALATION_SPRAY | Freq: Four times a day (QID) | RESPIRATORY_TRACT | 0 refills | Status: DC | PRN

## 2019-06-30 MED ORDER — DOXYCYCLINE HYCLATE 100 MG PO CAPS: 100.0000 mg | ORAL_CAPSULE | Freq: Two times a day (BID) | ORAL | 0 refills | Status: AC

## 2019-06-30 MED ORDER — PSEUDOEPH-BROMPHEN-DM 30-2-10 MG/5ML PO SYRP: 5.0000 mL | ORAL_SOLUTION | Freq: Four times a day (QID) | ORAL | 0 refills | Status: DC | PRN

## 2019-06-30 MED ORDER — PREDNISONE 50 MG PO TABS: 50.0000 mg | ORAL_TABLET | Freq: Every day | ORAL | 0 refills | Status: AC

## 2019-06-30 NOTE — ED Provider Notes (Signed)
[place]    CSN: [id number] Arrival date & time: 06/30/19  0857      History   Chief Complaint Chief Complaint  Patient presents with   Cough    HPI Darlene Wright is a 28 y.o. female history of anxiety, PCOS, presenting today for evaluation of a cough.  Cough is been going on for 5 weeks.  States that it began early August.  She was tested negative for COVID early in August.  Was seen in the emergency room on 8/24 and had negative chest x-ray.  She was prescribed Tessalon.  States that her cough is persisted.  States that some days she will not cough, while other days she will have coughing spells.  Denies any specific triggers.  Denies rhinorrhea or congestion.  Denies sore throat.  Denies fevers.  Denies any known new exposure to COVID.  Occasional have some chest discomfort related to coughing.  Denies leg pain or leg swelling.  Denies previous DVT/PE.  Denies recent travel or immobilization.  Patient is on oral contraceptives-low Loestrin.  She has been on this for approximately 10 years.  Denies history of tobacco use.  HPI  Past Medical History:  Diagnosis Date   Anxiety    Fibroadenoma of both breasts     Patient Active Problem List   Diagnosis Date Noted   BREAST MASS, LEFT 05/08/2009    Past Surgical History:  Procedure Laterality Date   WISDOM TOOTH EXTRACTION     WISDOM TOOTH EXTRACTION      OB History    Gravida  0   Para  0   Term  0   Preterm  0   AB  0   Living  0     SAB  0   TAB  0   Ectopic  0   Multiple  0   Live Births  0            Home Medications    Prior to Admission medications   Medication Sig Start Date End Date Taking? Authorizing Provider  albuterol (VENTOLIN HFA) 108 (90 Base) MCG/ACT inhaler Inhale 1-2 puffs into the lungs every 6 (six) hours as needed for wheezing or shortness of breath. 06/30/19   Brenn Deziel C, PA-C  brompheniramine-pseudoephedrine-DM 30-2-10 MG/5ML syrup Take 5 mLs by  mouth 4 (four) times daily as needed. 06/30/19   Jaydn Fincher C, PA-C  Cetirizine HCl 10 MG CAPS Take 1 capsule (10 mg total) by mouth daily for 10 days. 06/30/19 07/10/19  Idamay Hosein C, PA-C  doxycycline (VIBRAMYCIN) 100 MG capsule Take 1 capsule (100 mg total) by mouth 2 (two) times daily for 10 days. 06/30/19 07/10/19  Channon Brougher C, PA-C  hydrOXYzine (ATARAX/VISTARIL) 10 MG tablet Take 10 mg by mouth 3 (three) times daily as needed.    [provider]  IBUPROFEN PO Take by mouth.    [provider]  Multiple Vitamins-Minerals (MULTIVITAMIN PO) Take by mouth.    [provider]  predniSONE (DELTASONE) 50 MG tablet Take 1 tablet (50 mg total) by mouth daily for 5 days. 06/30/19 07/05/19  Lois Slagel C, PA-C  Levonorgestrel ([name] IU) by Intrauterine route.  06/30/19  [provider]    Family History Family History  Problem Relation Age of Onset   Hypertension Mother    Hypertension Father    Diabetes Maternal Grandmother    Hypertension Maternal Grandmother    Hypertension Maternal Grandfather    Hypertension Paternal  Grandmother    Leukemia Paternal Grandfather    Diabetes Paternal Grandfather    Hypertension Paternal Grandfather     Social History Social History   Tobacco Use   Smoking status: Never Smoker   Smokeless tobacco: Never Used  Substance Use Topics   Alcohol use: Yes    Comment: social   Drug use: No     Allergies   Patient has no known allergies.   Review of Systems Review of Systems  Constitutional: Negative for activity change, appetite change, chills, fatigue and fever.  HENT: Negative for congestion, ear pain, rhinorrhea, sinus pressure, sore throat and trouble swallowing.   Eyes: Negative for discharge and redness.  Respiratory: Positive for cough. Negative for chest tightness and shortness of breath.   Cardiovascular: Negative for chest pain.  Gastrointestinal: Negative for abdominal pain,  diarrhea, nausea and vomiting.  Musculoskeletal: Negative for myalgias.  Skin: Negative for rash.  Neurological: Negative for dizziness, light-headedness and headaches.     Physical Exam Triage Vital Signs ED Triage Vitals  Enc Vitals Group     BP 06/30/19 0927 117/70     Pulse Rate 06/30/19 0927 61     Resp 06/30/19 0927 16     Temp 06/30/19 0927 97.8 F (36.6 C)     Temp Source 06/30/19 0927 Temporal     SpO2 06/30/19 0927 100 %     Weight 06/30/19 0929 154 lb (69.9 kg)     Height --      Head Circumference --      Peak Flow --      Pain Score 06/30/19 0929 0     Pain Loc --      Pain Edu? --      Excl. in [place]? --    No data found.  Updated Vital Signs BP 117/70 (BP Location: Left Arm)    Pulse 61    Temp 97.8 F (36.6 C) (Temporal)    Resp 16    Wt 154 lb (69.9 kg)    SpO2 100%    BMI 26.03 kg/m   Visual Acuity Right Eye Distance:   Left Eye Distance:   Bilateral Distance:    Right Eye Near:   Left Eye Near:    Bilateral Near:     Physical Exam Vitals signs and nursing note reviewed.  Constitutional:      General: She is not in acute distress.    Appearance: She is well-developed.  HENT:     Head: Normocephalic and atraumatic.     Ears:     Comments: Bilateral ears without tenderness to palpation of external auricle, tragus and mastoid, EAC's without erythema or swelling, TM's with good bony landmarks and cone of light. Non erythematous.     Mouth/Throat:     Comments: Oral mucosa pink and moist, no tonsillar enlargement or exudate. Posterior pharynx patent and nonerythematous, no uvula deviation or swelling. Normal phonation.  Eyes:     Conjunctiva/sclera: Conjunctivae normal.  Neck:     Musculoskeletal: Neck supple.  Cardiovascular:     Rate and Rhythm: Normal rate and regular rhythm.     Heart sounds: No murmur.  Pulmonary:     Effort: Pulmonary effort is normal. No respiratory distress.     Breath sounds: Normal breath sounds.     Comments:  Breathing comfortably at rest, CTABL, no wheezing, rales or other adventitious sounds auscultated  Infrequent coughing during visit Abdominal:     Palpations: Abdomen is soft.  Tenderness: There is no abdominal tenderness.  Musculoskeletal:     Comments: Bilateral lower extremities symmetric, no calf tenderness or swelling, negative Homans bilaterally  Skin:    General: Skin is warm and dry.  Neurological:     Mental Status: She is alert.      UC Treatments / Results  Labs (all labs ordered are listed, but only abnormal results are displayed) Labs Reviewed  NOVEL CORONAVIRUS, NAA (HOSP ORDER, SEND-OUT TO REF LAB; TAT 18-24 HRS)    EKG   Radiology No results found.  Procedures Procedures (including critical care time)  Medications Ordered in UC Medications - No data to display  Initial Impression / Assessment and Plan / UC Course  I have reviewed the triage vital signs and the nursing notes.  Pertinent labs & imaging results that were available during my care of the patient were reviewed by me and considered in my medical decision making (see chart for details).     Patient with cough x5 to 6 weeks.  Given length of symptoms will place on doxycycline to cover atypicals.  Providing course of prednisone although no significant wheezing.  Albuterol inhaler as needed for coughing spells and shortness of breath, Zyrtec to help with drainage.  Cough syrup as needed.  Chest x-ray 2 weeks ago which was negative, no worsening of symptoms, vital signs stable, deferring repeat.  PERC negative.  Is on birth control, but without tachycardia or hypoxia.  Feel this is less likely, but advised to continue to monitor symptoms and follow-up in ED if developing worsening symptoms.Discussed strict return precautions. Patient verbalized understanding and is agreeable with plan.  Final Clinical Impressions(s) / UC Diagnoses   Final diagnoses:  Lower resp. tract infection  Cough      Discharge Instructions     Begin doxycycline twice daily for the next 10 days Prednisone daily with food x5 days Use albuterol inhaler as needed for cough, shortness of breath Daily cetirizine to help with any drainage contributing to cough Cough syrup as needed  Please follow-up if symptoms persisting, worsening, developing chest pain, increase shortness of breath, difficulty breathing, dizziness or lightheadedness, leg swelling   ED Prescriptions    Medication Sig Dispense Auth. Provider   doxycycline (VIBRAMYCIN) 100 MG capsule Take 1 capsule (100 mg total) by mouth 2 (two) times daily for 10 days. 20 capsule Siara Gorder C, PA-C   albuterol (VENTOLIN HFA) 108 (90 Base) MCG/ACT inhaler Inhale 1-2 puffs into the lungs every 6 (six) hours as needed for wheezing or shortness of breath. 8 g Marleen Moret C, PA-C   predniSONE (DELTASONE) 50 MG tablet Take 1 tablet (50 mg total) by mouth daily for 5 days. 5 tablet Riven Mabile C, PA-C   Cetirizine HCl 10 MG CAPS Take 1 capsule (10 mg total) by mouth daily for 10 days. 10 capsule Jasper Hanf C, PA-C   brompheniramine-pseudoephedrine-DM 30-2-10 MG/5ML syrup Take 5 mLs by mouth 4 (four) times daily as needed. 120 mL Adrieanna Boteler C, PA-C     Controlled Substance Prescriptions [place] Controlled Substance Registry consulted? Not Applicable   Janith Lima, [place] 06/30/19 1035

## 2019-06-30 NOTE — Discharge Instructions (Signed)
Begin doxycycline twice daily for the next 10 days Prednisone daily with food x5 days Use albuterol inhaler as needed for cough, shortness of breath Daily cetirizine to help with any drainage contributing to cough Cough syrup as needed  Please follow-up if symptoms persisting, worsening, developing chest pain, increase shortness of breath, difficulty breathing, dizziness or lightheadedness, leg swelling

## 2019-06-30 NOTE — ED Triage Notes (Signed)
Pt states she has a cough x 5 weeks. Pt has been to the ER and she has been tested for Covid  Aug 7 ,2020. Pt was negative for Covid. Pt states she has a dry cough.

## 2019-07-02 LAB — NOVEL CORONAVIRUS, NAA (HOSP ORDER, SEND-OUT TO REF LAB; TAT 18-24 HRS): SARS-CoV-2, NAA: NOT DETECTED

## 2019-07-05 ENCOUNTER — Encounter (HOSPITAL_COMMUNITY): Payer: Self-pay

## 2019-07-05 DIAGNOSIS — E282 Polycystic ovarian syndrome: Secondary | ICD-10-CM | POA: Diagnosis not present

## 2019-07-08 DIAGNOSIS — F4310 Post-traumatic stress disorder, unspecified: Secondary | ICD-10-CM | POA: Diagnosis not present

## 2019-07-23 DIAGNOSIS — F4310 Post-traumatic stress disorder, unspecified: Secondary | ICD-10-CM | POA: Diagnosis not present

## 2019-08-06 DIAGNOSIS — F4310 Post-traumatic stress disorder, unspecified: Secondary | ICD-10-CM | POA: Diagnosis not present

## 2019-08-12 ENCOUNTER — Other Ambulatory Visit: Payer: Self-pay

## 2019-08-12 ENCOUNTER — Encounter: Payer: Self-pay | Admitting: Critical Care Medicine

## 2019-08-12 ENCOUNTER — Ambulatory Visit: Payer: BLUE CROSS/BLUE SHIELD | Admitting: Critical Care Medicine

## 2019-08-12 VITALS — BP 122/80 | HR 78 | Temp 97.3°F | Ht 64.5 in | Wt 156.0 lb

## 2019-08-12 DIAGNOSIS — R05 Cough: Secondary | ICD-10-CM

## 2019-08-12 DIAGNOSIS — K219 Gastro-esophageal reflux disease without esophagitis: Secondary | ICD-10-CM | POA: Diagnosis not present

## 2019-08-12 DIAGNOSIS — Z23 Encounter for immunization: Secondary | ICD-10-CM

## 2019-08-12 DIAGNOSIS — R059 Cough, unspecified: Secondary | ICD-10-CM

## 2019-08-12 MED ORDER — OMEPRAZOLE 20 MG PO CPDR: 20.0000 mg | DELAYED_RELEASE_CAPSULE | Freq: Every day | ORAL | 11 refills | Status: DC

## 2019-08-12 NOTE — Patient Instructions (Addendum)
Thank you for visiting Dr. Carlis Abbott at [facility]. We recommend the following:   Meds ordered this encounter  Medications  . omeprazole (PRILOSEC) 20 MG capsule    Sig: Take 1 capsule (20 mg total) by mouth daily.    Dispense:  30 capsule    Refill:  11    Return in about 4 weeks (around 09/09/2019).    Please do your part to reduce the spread of COVID-19.   Food Choices for Gastroesophageal Reflux Disease, Adult When you have gastroesophageal reflux disease (GERD), the foods you eat and your eating habits are very important. Choosing the right foods can help ease your discomfort. Think about working with a nutrition specialist (dietitian) to help you make good choices. What are tips for following this plan?  Meals  Choose healthy foods that are low in fat, such as fruits, vegetables, whole grains, low-fat dairy products, and lean meat, fish, and poultry.  Eat small meals often instead of 3 large meals a day. Eat your meals slowly, and in a place where you are relaxed. Avoid bending over or lying down until 2-3 hours after eating.  Avoid eating meals 2-3 hours before bed.  Avoid drinking a lot of liquid with meals.  Cook foods using methods other than frying. Bake, grill, or broil food instead.  Avoid or limit: ? Chocolate. ? Peppermint or spearmint. ? Alcohol. ? Pepper. ? Black and decaffeinated coffee. ? Black and decaffeinated tea. ? Bubbly (carbonated) soft drinks. ? Caffeinated energy drinks and soft drinks.  Limit high-fat foods such as: ? Fatty meat or fried foods. ? Whole milk, cream, butter, or ice cream. ? Nuts and nut butters. ? Pastries, donuts, and sweets made with butter or shortening.  Avoid foods that cause symptoms. These foods may be different for everyone. Common foods that cause symptoms include: ? Tomatoes. ? Oranges, lemons, and limes. ? Peppers. ? Spicy food. ? Onions and garlic. ? Vinegar. Lifestyle  Maintain a healthy weight. Ask  your doctor what weight is healthy for you. If you need to lose weight, work with your doctor to do so safely.  Exercise for at least 30 minutes for 5 or more days each week, or as told by your doctor.  Wear loose-fitting clothes.  Do not smoke. If you need help quitting, ask your doctor.  Sleep with the head of your bed higher than your feet. Use a wedge under the mattress or blocks under the bed frame to raise the head of the bed. Summary  When you have gastroesophageal reflux disease (GERD), food and lifestyle choices are very important in easing your symptoms.  Eat small meals often instead of 3 large meals a day. Eat your meals slowly, and in a place where you are relaxed.  Limit high-fat foods such as fatty meat or fried foods.  Avoid bending over or lying down until 2-3 hours after eating.  Avoid peppermint and spearmint, caffeine, alcohol, and chocolate. This information is not intended to replace advice given to you by your health care provider. Make sure you discuss any questions you have with your health care provider. Document Released: 04/07/2012 Document Revised: 01/28/2019 Document Reviewed: 11/12/2016 Elsevier Patient Education  2020 [employer].

## 2019-08-12 NOTE — Progress Notes (Signed)
Synopsis: Referred in October 2020 for cough by self.  Subjective:   PATIENT ID: Darlene Wright GENDER: female DOB: 1991-07-03, MRN: [id number]  Chief Complaint  Patient presents with   Consult    dry cough- end of july 2020 beginning of august 2020    Darlene Wright is a 28 year old woman who presents for evaluation of a chronic cough that began in July.  She has never had this issue before, and did not have any preceding events that explain the symptoms.  She has had an upper respiratory infection most winters, but she is always able to quickly get over them.  Her cough occurs at random times, especially in the late evening, but tends to stop overnight.  She feels like during the day she is constantly fighting the urge to cough.  She has had associated shortness of breath periodically, but not constantly throughout this time.  She has had headache for about a week and poor appetite for about 3 days.  She denies heartburn, nasal congestion, postnasal drip.  Her cough is dry, is worsened by strong smells, hairspray, and Vicks VapoRub.  Talking worsens her cough.  At times her cough has been bad enough to cause posttussive vomiting.  Tessalon has not been helpful, and she is not sure that albuterol as needed has either.  She had symptomatic benefit when she was given the combination of cetirizine for 10 days, a course of doxycycline, prednisone for 5 days, and bropheniramine pseudoephedrine DM cough syrup which was prescribed by urgent care.  She was told to only do this for 10 days, and follow-up if her cough recurred, which it did.  She has had pets for several years-2 cats and 1 dog.  She does not have birds.  She has no history of eczema, asthma, seasonal allergies, or food allergies.  She has never smoked or vape.  She works as an Optometrist, and was at home for several months before returning to the office in September.  She does not notice that her symptoms change depending on the weather.  There is  no family history of lung disease.  Although she does not have a history of heartburn or sensation of reflux, she drinks Dr. Malachi Wright frequently-at least 24 ounces per day, sometimes more.  She previously took NSAIDs periodically, but has not been recently.  She does not regularly eat mints.  She does not usually eat late at night.  She infrequently drinks alcohol, never in excess.        Past Medical History:  Diagnosis Date   Anxiety    Fibroadenoma of both breasts    PCOS (polycystic ovarian syndrome)      Family History  Problem Relation Age of Onset   Hypertension Mother    Alcohol abuse Mother    Hypertension Father    Alcohol abuse Father    Diabetes Maternal Grandmother    Hypertension Maternal Grandmother    Hypertension Maternal Grandfather    Hypertension Paternal Grandmother    Leukemia Paternal Grandfather    Diabetes Paternal Grandfather    Hypertension Paternal Grandfather    Polycystic ovary syndrome Sister    Post-traumatic stress disorder Sister    Bronchitis Sister    Pneumonia Sister      Past Surgical History:  Procedure Laterality Date   WISDOM TOOTH EXTRACTION     WISDOM TOOTH EXTRACTION      Social History   Socioeconomic History   Marital status: Married    Spouse  name: Not on file   Number of children: Not on file   Years of education: Not on file   Highest education level: Not on file  Occupational History   Not on file  Social Needs   Financial resource strain: Not on file   Food insecurity    Worry: Not on file    Inability: Not on file   Transportation needs    Medical: Not on file    Non-medical: Not on file  Tobacco Use   Smoking status: Passive Smoke Exposure - Never Smoker   Smokeless tobacco: Never Used  Substance and Sexual Activity   Alcohol use: Yes    Comment: social   Drug use: No   Sexual activity: Yes    Birth control/protection: I.U.D.    Comment: kyleena placed 02-27-18   Lifestyle   Physical activity    Days per week: Not on file    Minutes per session: Not on file   Stress: Not on file  Relationships   Social connections    Talks on phone: Not on file    Gets together: Not on file    Attends religious service: Not on file    Active member of club or organization: Not on file    Attends meetings of clubs or organizations: Not on file    Relationship status: Not on file   Intimate partner violence    Fear of current or ex partner: Not on file    Emotionally abused: Not on file    Physically abused: Not on file    Forced sexual activity: Not on file  Other Topics Concern   Not on file  Social History Narrative   Not on file     No Known Allergies   Immunization History  Administered Date(s) Administered   Influenza,inj,Quad PF,6+ Mos 08/12/2019   Tdap 05/12/2015    Outpatient Medications Prior to Visit  Medication Sig Dispense Refill   acetaminophen (TYLENOL) 500 MG tablet Take 500 mg by mouth every 6 (six) hours as needed.     albuterol (VENTOLIN HFA) 108 (90 Base) MCG/ACT inhaler Inhale 1-2 puffs into the lungs every 6 (six) hours as needed for wheezing or shortness of breath. 8 g 0   hydrOXYzine (ATARAX/VISTARIL) 10 MG tablet Take 10 mg by mouth 3 (three) times daily as needed.     ALTAVERA 0.15-30 MG-MCG tablet Take 1 tablet by mouth daily.     buPROPion (WELLBUTRIN SR) 150 MG 12 hr tablet Take 150 mg by mouth every morning.     spironolactone (ALDACTONE) 50 MG tablet Take 50 mg by mouth daily.     brompheniramine-pseudoephedrine-DM 30-2-10 MG/5ML syrup Take 5 mLs by mouth 4 (four) times daily as needed. (Patient not taking: Reported on 08/12/2019) 120 mL 0   Cetirizine HCl 10 MG CAPS Take 1 capsule (10 mg total) by mouth daily for 10 days. 10 capsule 0   IBUPROFEN PO Take by mouth.     Multiple Vitamins-Minerals (MULTIVITAMIN PO) Take by mouth.     No facility-administered medications prior to visit.     Review  of Systems  Constitutional: Negative for chills, diaphoresis, fever, malaise/fatigue and weight loss.  HENT: Negative for congestion, ear pain and sore throat.        No postnasal drip  Respiratory: Positive for cough and shortness of breath. Negative for hemoptysis, sputum production and wheezing.   Cardiovascular: Positive for chest pain. Negative for palpitations and leg swelling.  Gastrointestinal: Negative for  abdominal pain, heartburn and nausea.  Genitourinary: Negative for frequency.  Musculoskeletal: Positive for myalgias. Negative for joint pain.  Skin: Positive for itching. Negative for rash.  Neurological: Positive for dizziness. Negative for weakness and headaches.  Endo/Heme/Allergies: Does not bruise/bleed easily.  Psychiatric/Behavioral: Positive for depression. The patient is nervous/anxious.      Objective:   Vitals:   08/12/19 1532 08/12/19 1534  BP:  122/80  Pulse:  78  Temp: (!) 97.3 F (36.3 C)   TempSrc: Temporal   SpO2:  100%  Weight: 156 lb (70.8 kg)   Height: 5' 4.5" (1.638 m)    100% on  RA BMI Readings from Last 3 Encounters:  08/12/19 26.36 kg/m  06/30/19 26.03 kg/m  04/03/18 24.00 kg/m   Wt Readings from Last 3 Encounters:  08/12/19 156 lb (70.8 kg)  06/30/19 154 lb (69.9 kg)  04/03/18 142 lb (64.4 kg)    Physical Exam Vitals signs reviewed.  Constitutional:      Appearance: She is not ill-appearing.  HENT:     Head: Normocephalic and atraumatic.     Nose:     Comments: Deferred due to masking requirement.    Mouth/Throat:     Comments: Deferred due to masking requirement. Eyes:     General: No scleral icterus. Neck:     Musculoskeletal: Neck supple.  Cardiovascular:     Rate and Rhythm: Normal rate and regular rhythm.     Heart sounds: No murmur.  Pulmonary:     Comments: Breathing comfortably on room air, frequent dry coughing, not intractable.  Clear to auscultation bilaterally. Abdominal:     General: There is no  distension.     Palpations: Abdomen is soft.     Tenderness: There is no abdominal tenderness.  Musculoskeletal:        General: No swelling or deformity.  Lymphadenopathy:     Cervical: No cervical adenopathy.  Skin:    General: Skin is warm and dry.     Findings: No bruising or rash.  Neurological:     General: No focal deficit present.     Mental Status: She is alert.     Motor: No weakness.     Coordination: Coordination normal.  Psychiatric:        Mood and Affect: Mood normal.        Behavior: Behavior normal.      CBC    Component Value Date/Time   WBC 6.7 06/14/2019 1147   RBC 4.71 06/14/2019 1147   HGB 13.8 06/14/2019 1147   HCT 41.3 06/14/2019 1147   PLT 256 06/14/2019 1147   MCV 87.7 06/14/2019 1147   MCH 29.3 06/14/2019 1147   MCHC 33.4 06/14/2019 1147   RDW 11.9 06/14/2019 1147   LYMPHSABS 2.1 10/19/2016 1620   MONOABS 1.1 (H) 10/19/2016 1620   EOSABS 0.2 10/19/2016 1620   BASOSABS 0.0 10/19/2016 1620    SARS-CoV-2 PCR 05/28/2019-negative  Chest Imaging- films reviewed: CXR, 2 view 06/14/2019-normal  CTA chest 10/19/2016- patulous, dilated esophagus.  No significant mediastinal or hilar adenopathy.  No PE.  Normal lungs.  Pulmonary Functions Testing Results: No flowsheet data found.        Assessment & Plan:     ICD-10-CM   1. Gastroesophageal reflux disease, unspecified whether esophagitis present  K21.9 omeprazole (PRILOSEC) 20 MG capsule  2. Cough  R05 omeprazole (PRILOSEC) 20 MG capsule  3. Need for immunization against influenza  Z23 Flu Vaccine QUAD 36+ mos IM  Chronic cough-based on CT scan I think she is very high risk for GERD despite her lack of heartburn -Omeprazole 20 mg once daily; recommend taking 30 minutes before breakfast -Discussed cutting back on her carbonated beverage and caffeine consumption; recommended limiting things entirely -Discussed other dietary and lifestyle modifications for GERD management -Maintain a  healthy weight -Okay to continue over-the-counter cough suppressants -Flu vaccine today -I anticipate her cough will take a week or 2 to improve.  RTC in about 1 month.    Current Outpatient Medications:    acetaminophen (TYLENOL) 500 MG tablet, Take 500 mg by mouth every 6 (six) hours as needed., Disp: , Rfl:    albuterol (VENTOLIN HFA) 108 (90 Base) MCG/ACT inhaler, Inhale 1-2 puffs into the lungs every 6 (six) hours as needed for wheezing or shortness of breath., Disp: 8 g, Rfl: 0   hydrOXYzine (ATARAX/VISTARIL) 10 MG tablet, Take 10 mg by mouth 3 (three) times daily as needed., Disp: , Rfl:    ALTAVERA 0.15-30 MG-MCG tablet, Take 1 tablet by mouth daily., Disp: , Rfl:    buPROPion (WELLBUTRIN SR) 150 MG 12 hr tablet, Take 150 mg by mouth every morning., Disp: , Rfl:    omeprazole (PRILOSEC) 20 MG capsule, Take 1 capsule (20 mg total) by mouth daily., Disp: 30 capsule, Rfl: 11   spironolactone (ALDACTONE) 50 MG tablet, Take 50 mg by mouth daily., Disp: , Rfl:    Julian Hy, DO [facility] 08/12/2019 5:10 PM

## 2019-08-18 ENCOUNTER — Ambulatory Visit (INDEPENDENT_AMBULATORY_CARE_PROVIDER_SITE_OTHER): Payer: BLUE CROSS/BLUE SHIELD

## 2019-08-18 ENCOUNTER — Telehealth: Payer: Self-pay | Admitting: Critical Care Medicine

## 2019-08-18 ENCOUNTER — Encounter: Payer: Self-pay | Admitting: Adult Health

## 2019-08-18 ENCOUNTER — Telehealth (INDEPENDENT_AMBULATORY_CARE_PROVIDER_SITE_OTHER): Payer: BLUE CROSS/BLUE SHIELD | Admitting: Adult Health

## 2019-08-18 DIAGNOSIS — R0600 Dyspnea, unspecified: Secondary | ICD-10-CM

## 2019-08-18 DIAGNOSIS — R059 Cough, unspecified: Secondary | ICD-10-CM

## 2019-08-18 DIAGNOSIS — R05 Cough: Secondary | ICD-10-CM

## 2019-08-18 LAB — CBC WITH DIFFERENTIAL/PLATELET
Basophils Absolute: 0.1 10*3/uL (ref 0.0–0.1)
Basophils Relative: 0.9 % (ref 0.0–3.0)
Eosinophils Absolute: 0.1 10*3/uL (ref 0.0–0.7)
Eosinophils Relative: 1.6 % (ref 0.0–5.0)
HCT: 41.6 % (ref 36.0–46.0)
Hemoglobin: 14.3 g/dL (ref 12.0–15.0)
Lymphocytes Relative: 28.4 % (ref 12.0–46.0)
Lymphs Abs: 1.9 10*3/uL (ref 0.7–4.0)
MCHC: 34.3 g/dL (ref 30.0–36.0)
MCV: 85.7 fl (ref 78.0–100.0)
Monocytes Absolute: 0.7 10*3/uL (ref 0.1–1.0)
Monocytes Relative: 9.7 % (ref 3.0–12.0)
Neutro Abs: 4 10*3/uL (ref 1.4–7.7)
Neutrophils Relative %: 59.4 % (ref 43.0–77.0)
Platelets: 254 10*3/uL (ref 150.0–400.0)
RBC: 4.86 Mil/uL (ref 3.87–5.11)
RDW: 12.1 % (ref 11.5–15.5)
WBC: 6.8 10*3/uL (ref 4.0–10.5)

## 2019-08-18 MED ORDER — QVAR REDIHALER 40 MCG/ACT IN AERB: 1.0000 | INHALATION_SPRAY | Freq: Two times a day (BID) | RESPIRATORY_TRACT | 0 refills | Status: DC

## 2019-08-18 MED ORDER — QVAR REDIHALER 40 MCG/ACT IN AERB: 2.0000 | INHALATION_SPRAY | Freq: Two times a day (BID) | RESPIRATORY_TRACT | 0 refills | Status: DC

## 2019-08-18 MED ORDER — PREDNISONE 10 MG PO TABS: 10.0000 mg | ORAL_TABLET | Freq: Every day | ORAL | 0 refills | Status: DC

## 2019-08-18 MED ORDER — BENZONATATE 200 MG PO CAPS: 200.0000 mg | ORAL_CAPSULE | Freq: Three times a day (TID) | ORAL | 1 refills | Status: DC | PRN

## 2019-08-18 NOTE — Progress Notes (Signed)
Virtual Visit via Video Note  I connected with Darlene Wright on 08/18/19 at 10:30 AM EDT by a video enabled telemedicine application and verified that I am speaking with the correct person using two identifiers.  Location: Patient: Work  Provider: Home   I discussed the limitations of evaluation and management by telemedicine and the availability of in person appointments. The patient expressed understanding and agreed to proceed.  History of Present Illness: 28 year old female never smoker seen for pulmonary consult August 12, 2019 for chronic cough that began in July 2020. Medical history is significant for anxiety depression, panic attacks and PCOS  Today's office visit is for an acute visit for cough and shortness of breath.  Patient complains over the last 24 hours that she has developed episodes of shortness of breath.  She feels that she has a tightening along her lungs upper chest and throat area that make it difficult to breathe at times.  Patient was seen on October 22 for a pulmonary consult for chronic cough is been present since July 2020.  Patient was seen in the emergency room in August with a normal chest x-ray.  She was given doxycycline, prednisone, Zyrtec and prescription cough syrup which she says did help some but did not eliminate her cough.  She did have Covid testing at urgent care on September 9 that was negative.  At pulmonary consult she was felt to have a chronic cough with possible GERD component.  She was recommended on dietary changes to decrease carbonated beverages and to begin omeprazole daily.  Patient says her cough has decreased but has not resolved.  She has barking episodes when she coughs.  And over the last days she has had episodes of shortness of breath.  Patient denies any exertional chest pain, hemoptysis, calf pain, abdominal pain nausea vomiting diarrhea.  She has had no fever.  Has had a history of asthma.  Did have secondary smoke exposure growing up.   She is a never smoker.  Has minimal alcohol use.  She is currently not using anything for cough.  She denies any sinus congestion, pressure.  Has occasional nasal drainage but does not consider it bad.  Strong smell seem to aggravate her cough.   Past Medical History:  Diagnosis Date  . Anxiety   . Fibroadenoma of both breasts   . PCOS (polycystic ovarian syndrome)      Current Outpatient Medications on File Prior to Visit  Medication Sig Dispense Refill  . acetaminophen (TYLENOL) 500 MG tablet Take 500 mg by mouth every 6 (six) hours as needed.    Marland Kitchen albuterol (VENTOLIN HFA) 108 (90 Base) MCG/ACT inhaler Inhale 1-2 puffs into the lungs every 6 (six) hours as needed for wheezing or shortness of breath. 8 g 0  . ALTAVERA 0.15-30 MG-MCG tablet Take 1 tablet by mouth daily.    Marland Kitchen buPROPion (WELLBUTRIN SR) 150 MG 12 hr tablet Take 150 mg by mouth every morning.    . hydrOXYzine (ATARAX/VISTARIL) 10 MG tablet Take 10 mg by mouth 3 (three) times daily as needed.    Marland Kitchen omeprazole (PRILOSEC) 20 MG capsule Take 1 capsule (20 mg total) by mouth daily. 30 capsule 11  . spironolactone (ALDACTONE) 50 MG tablet Take 50 mg by mouth daily.    . [DISCONTINUED] Levonorgestrel ([name] IU) by Intrauterine route.     No current facility-administered medications on file prior to visit.     Observations/Objective: Patient is at work.  She speaks in full  sentences.  No coughing or audible wheezing.  Appears alert and appropriate.  Assessment and Plan: Chronic cough-with possible GERD triggers.  We will also add chronic rhinitis prevention. Check chest x-ray today, labs with CBC to look at eosinophils and an IgE Begin cough suppression regimen with Delsym and Tessalon Perles GERD diet PPI  Dyspnea-questionable etiology concerned that patient may be developing new onset asthma. Patient is on birth control.  Will check a D-dimer.. If positive will need to consider a CTA with  PE protocol Will need PFTs going  forward  Discussed in detail that if dyspnea is not improving or worsens she will need to seek emergency care . She appears stable on video but unclear what is contributing to her dyspnea . Advised to try albuterol inhaler to see if this helps .   Plan  Patient Instructions  Prednisone 20mg  daily for 3 days.  Begin Deslym 2 tsp Twice daily  As needed  Cough.  Tessalon Three times a day  For cough As needed   Begin Zyrtec 10mg  At bedtime   Continue on Prilosec daily  Begin QVAR 40 1 puff Twice daily  , rinse after use. Albuterol inhaler 1-2 puffs every 4hrs as needed wheezing . Labs and chest xray today   Follow up with Dr. Carlis Abbott in 3 weeks as planned and As needed   Please contact office for sooner follow up if symptoms do not improve or worsen or seek emergency care       Follow Up Instructions: Follow up in 3 weeks and As needed      I discussed the assessment and treatment plan with the patient. The patient was provided an opportunity to ask questions and all were answered. The patient agreed with the plan and demonstrated an understanding of the instructions.   The patient was advised to call back or seek an in-person evaluation if the symptoms worsen or if the condition fails to improve as anticipated.  I provided 40 minutes of non-face-to-face time during this encounter.   Darlene Edison, NP

## 2019-08-18 NOTE — Addendum Note (Signed)
Addended by: Parke Poisson E on: 08/18/2019 03:26 PM   Modules accepted: Orders

## 2019-08-18 NOTE — Progress Notes (Signed)
Patient came to the office for labs, xray and sample pickup.  Patient instructed on use of QVAR 40.  Patient expressed understanding and demonstrated technique. Parke Poisson, CMA 08/18/19

## 2019-08-18 NOTE — Patient Instructions (Addendum)
Prednisone 20mg  daily for 3 days.  Begin Deslym 2 tsp Twice daily  As needed  Cough.  Tessalon Three times a day  For cough As needed   Begin Zyrtec 10mg  At bedtime   Continue on Prilosec daily  Begin QVAR 40 1 puff Twice daily  , rinse after use. Albuterol inhaler 1-2 puffs every 4hrs as needed wheezing . Labs and chest xray today   Follow up with Dr. Carlis Abbott in 3 weeks as planned and As needed   Please contact office for sooner follow up if symptoms do not improve or worsen or seek emergency care

## 2019-08-18 NOTE — Telephone Encounter (Signed)
Called and spoke to patient. Patient stated that she has been having increased shortness of breath with even just talking. Patient stated that for two hours yesterday and some time this morning she felt a tightness in her chest and throat.  Patient stated it was uncomfortable and scary. Patient see's Dr. Carlis Abbott and is being treated for GERD. Patient stated these are new symptoms and doesn't feel like GERD.  Scheduled patient for MyChart Video Visit.  Nothing further needed at this time.

## 2019-08-19 LAB — IGE: IgE (Immunoglobulin E), Serum: 10 kU/L (ref <=114)

## 2019-08-19 LAB — D-DIMER, QUANTITATIVE: D-Dimer, Quant: 0.26 mcg/mL FEU (ref <0.50)

## 2019-08-24 ENCOUNTER — Telehealth: Payer: Self-pay | Admitting: Adult Health

## 2019-08-24 DIAGNOSIS — Z0000 Encounter for general adult medical examination without abnormal findings: Secondary | ICD-10-CM

## 2019-08-24 NOTE — Telephone Encounter (Signed)
Patient was a self referral.  No PCP established as hers had retired.  OV not sent to any other provider Dr. Carlis Abbott please advise on a PCP or a referral

## 2019-08-25 NOTE — Telephone Encounter (Signed)
LMTCB

## 2019-08-25 NOTE — Telephone Encounter (Signed)
Yes she needs a new PCP

## 2019-08-27 DIAGNOSIS — F4310 Post-traumatic stress disorder, unspecified: Secondary | ICD-10-CM | POA: Diagnosis not present

## 2019-09-09 ENCOUNTER — Other Ambulatory Visit: Payer: Self-pay

## 2019-09-09 ENCOUNTER — Ambulatory Visit: Payer: BLUE CROSS/BLUE SHIELD | Admitting: Critical Care Medicine

## 2019-09-09 ENCOUNTER — Encounter: Payer: Self-pay | Admitting: Critical Care Medicine

## 2019-09-09 VITALS — BP 116/66 | HR 75 | Temp 97.8°F | Ht 64.5 in | Wt 152.4 lb

## 2019-09-09 DIAGNOSIS — K219 Gastro-esophageal reflux disease without esophagitis: Secondary | ICD-10-CM | POA: Diagnosis not present

## 2019-09-09 DIAGNOSIS — R059 Cough, unspecified: Secondary | ICD-10-CM

## 2019-09-09 DIAGNOSIS — R05 Cough: Secondary | ICD-10-CM

## 2019-09-09 NOTE — Patient Instructions (Addendum)
Thank you for visiting Dr. Carlis Abbott at  [facility]. We recommend the following:  Stay on omerazole every day. Continue to avoid caffeine and carbonated beverages. Follow GERD diet.    Return in about 3 months (around 12/10/2019).    Please do your part to reduce the spread of COVID-19.

## 2019-09-09 NOTE — Progress Notes (Signed)
Synopsis: Referred in October 2020 for cough by self.  Subjective:   PATIENT ID: Darlene Bores GENDER: female DOB: 09/28/1991, MRN: [id number]  Chief Complaint  Patient presents with  . Follow-up    Patient here for persistant cough. Patient states that it was worse in the beginning and has now a little better. Patient also states that she saw Tammy NP for chest pain and that it is better    Darlene Wright is a 28 year old woman here for follow-up of shortness of breath and GERD.  Since changing her diet and starting omeprazole her symptoms have significantly improved.  She generally has about 2 good days for 1 worse day.  She does notice that if she drinks a soda on the weekends, the next day she will not feel as good.  She has otherwise basically eliminated soda, down from drinking several per day.  She has started drinking more herbal teas rather than caffeinated beverages.  She does drink 1 caffeinated tea in the morning.  She has remained on omeprazole.  She does not take NSAIDs.  Shortly after her last visit she had 2 episodes that were associated with difficulty breathing, but not difficulty talking or swallowing.  One lasted about 2 hours, the other was shorter.  It felt like a spasm in the center of her chest.  She took a short course of prednisone, dextromethorphan, and felt improved.    OV 08/12/2019: Darlene Wright is a 28 year old woman who presents for evaluation of a chronic cough that began in July.  She has never had this issue before, and did not have any preceding events that explain the symptoms.  She has had an upper respiratory infection most winters, but she is always able to quickly get over them.  Her cough occurs at random times, especially in the late evening, but tends to stop overnight.  She feels like during the day she is constantly fighting the urge to cough.  She has had associated shortness of breath periodically, but not constantly throughout this time.  She has had  headache for about a week and poor appetite for about 3 days.  She denies heartburn, nasal congestion, postnasal drip.  Her cough is dry, is worsened by strong smells, hairspray, and Vicks VapoRub.  Talking worsens her cough.  At times her cough has been bad enough to cause posttussive vomiting.  Tessalon has not been helpful, and she is not sure that albuterol as needed has either.  She had symptomatic benefit when she was given the combination of cetirizine for 10 days, a course of doxycycline, prednisone for 5 days, and bropheniramine pseudoephedrine DM cough syrup which was prescribed by urgent care.  She was told to only do this for 10 days, and follow-up if her cough recurred, which it did.  She has had pets for several years-2 cats and 1 dog.  She does not have birds.  She has no history of eczema, asthma, seasonal allergies, or food allergies.  She has never smoked or vape.  She works as an Optometrist, and was at home for several months before returning to the office in September.  She does not notice that her symptoms change depending on the weather.  There is no family history of lung disease.  Although she does not have a history of heartburn or sensation of reflux, she drinks Dr. Malachi Bonds frequently-at least 24 ounces per day, sometimes more.  She previously took NSAIDs periodically, but has not been recently.  She  does not regularly eat mints.  She does not usually eat late at night.  She infrequently drinks alcohol, never in excess.    Past Medical History:  Diagnosis Date  . Anxiety   . Fibroadenoma of both breasts   . PCOS (polycystic ovarian syndrome)      Family History  Problem Relation Age of Onset  . Hypertension Mother   . Alcohol abuse Mother   . Hypertension Father   . Alcohol abuse Father   . Diabetes Maternal Grandmother   . Hypertension Maternal Grandmother   . Hypertension Maternal Grandfather   . Hypertension Paternal Grandmother   . Leukemia Paternal Grandfather   .  Diabetes Paternal Grandfather   . Hypertension Paternal Grandfather   . Polycystic ovary syndrome Sister   . Post-traumatic stress disorder Sister   . Bronchitis Sister   . Pneumonia Sister      Past Surgical History:  Procedure Laterality Date  . WISDOM TOOTH EXTRACTION    . WISDOM TOOTH EXTRACTION      Social History   Socioeconomic History  . Marital status: Married    Spouse name: Not on file  . Number of children: Not on file  . Years of education: Not on file  . Highest education level: Not on file  Occupational History  . Not on file  Social Needs  . Financial resource strain: Not on file  . Food insecurity    Worry: Not on file    Inability: Not on file  . Transportation needs    Medical: Not on file    Non-medical: Not on file  Tobacco Use  . Smoking status: Passive Smoke Exposure - Never Smoker  . Smokeless tobacco: Never Used  Substance and Sexual Activity  . Alcohol use: Yes    Comment: social  . Drug use: No  . Sexual activity: Yes    Birth control/protection: I.U.D.    Comment: kyleena placed 02-27-18  Lifestyle  . Physical activity    Days per week: Not on file    Minutes per session: Not on file  . Stress: Not on file  Relationships  . Social Herbalist on phone: Not on file    Gets together: Not on file    Attends religious service: Not on file    Active member of club or organization: Not on file    Attends meetings of clubs or organizations: Not on file    Relationship status: Not on file  . Intimate partner violence    Fear of current or ex partner: Not on file    Emotionally abused: Not on file    Physically abused: Not on file    Forced sexual activity: Not on file  Other Topics Concern  . Not on file  Social History Narrative  . Not on file     No Known Allergies   Immunization History  Administered Date(s) Administered  . Influenza,inj,Quad PF,6+ Mos 08/12/2019  . Tdap 05/12/2015    Outpatient Medications Prior  to Visit  Medication Sig Dispense Refill  . albuterol (VENTOLIN HFA) 108 (90 Base) MCG/ACT inhaler Inhale 1-2 puffs into the lungs every 6 (six) hours as needed for wheezing or shortness of breath. 8 Wright 0  . ALTAVERA 0.15-30 MG-MCG tablet Take 1 tablet by mouth daily.    . beclomethasone (QVAR REDIHALER) 40 MCG/ACT inhaler Inhale 1 puff into the lungs 2 (two) times daily. 10.6 Wright 0  . buPROPion (WELLBUTRIN SR) 150 MG 12  hr tablet Take 150 mg by mouth every morning.    . hydrOXYzine (ATARAX/VISTARIL) 10 MG tablet Take 10 mg by mouth 3 (three) times daily as needed.    Marland Kitchen omeprazole (PRILOSEC) 20 MG capsule Take 1 capsule (20 mg total) by mouth daily. 30 capsule 11  . spironolactone (ALDACTONE) 50 MG tablet Take 50 mg by mouth daily.    Marland Kitchen acetaminophen (TYLENOL) 500 MG tablet Take 500 mg by mouth every 6 (six) hours as needed.    . benzonatate (TESSALON) 200 MG capsule Take 1 capsule (200 mg total) by mouth 3 (three) times daily as needed for cough. 30 capsule 1  . predniSONE (DELTASONE) 10 MG tablet Take 1 tablet (10 mg total) by mouth daily with breakfast. 3 tablet 0   No facility-administered medications prior to visit.     Review of Systems  Constitutional: Negative for chills, diaphoresis, fever, malaise/fatigue and weight loss.  HENT: Negative for congestion, ear pain and sore throat.        No postnasal drip  Respiratory: Positive for cough and shortness of breath. Negative for hemoptysis, sputum production and wheezing.   Cardiovascular: Positive for chest pain. Negative for palpitations and leg swelling.  Gastrointestinal: Negative for abdominal pain, heartburn and nausea.  Genitourinary: Negative for frequency.  Musculoskeletal: Positive for myalgias. Negative for joint pain.  Skin: Positive for itching. Negative for rash.  Neurological: Positive for dizziness. Negative for weakness and headaches.  Endo/Heme/Allergies: Does not bruise/bleed easily.  Psychiatric/Behavioral: Positive  for depression. The patient is nervous/anxious.      Objective:   Vitals:   09/09/19 1551  BP: 116/66  Pulse: 75  Temp: 97.8 F (36.6 C)  TempSrc: Temporal  SpO2: 99%  Weight: 152 lb 6.4 oz (69.1 kg)  Height: 5' 4.5" (1.638 m)   99% on  RA BMI Readings from Last 3 Encounters:  09/09/19 25.76 kg/m  08/12/19 26.36 kg/m  06/30/19 26.03 kg/m   Wt Readings from Last 3 Encounters:  09/09/19 152 lb 6.4 oz (69.1 kg)  08/12/19 156 lb (70.8 kg)  06/30/19 154 lb (69.9 kg)    Physical Exam Vitals signs reviewed.  Constitutional:      Appearance: She is not ill-appearing.  HENT:     Head: Normocephalic and atraumatic.     Nose:     Comments: Deferred due to masking requirement.    Mouth/Throat:     Comments: Deferred due to masking requirement. Eyes:     General: No scleral icterus. Neck:     Musculoskeletal: Neck supple.  Cardiovascular:     Rate and Rhythm: Normal rate and regular rhythm.     Heart sounds: No murmur.  Pulmonary:     Comments: Referred belching sound.  Put auscultation bilaterally, breathing comfortably on room air. Abdominal:     General: There is no distension.     Palpations: Abdomen is soft.     Tenderness: There is no abdominal tenderness.  Musculoskeletal:        General: No swelling or deformity.  Lymphadenopathy:     Cervical: No cervical adenopathy.  Skin:    General: Skin is warm and dry.     Findings: No bruising or rash.  Neurological:     General: No focal deficit present.     Mental Status: She is alert.     Motor: No weakness.     Coordination: Coordination normal.  Psychiatric:        Mood and Affect: Mood normal.  Behavior: Behavior normal.      CBC    Component Value Date/Time   WBC 6.8 08/18/2019 1124   RBC 4.86 08/18/2019 1124   HGB 14.3 08/18/2019 1124   HCT 41.6 08/18/2019 1124   PLT 254.0 08/18/2019 1124   MCV 85.7 08/18/2019 1124   MCH 29.3 06/14/2019 1147   MCHC 34.3 08/18/2019 1124   RDW 12.1  08/18/2019 1124   LYMPHSABS 1.9 08/18/2019 1124   MONOABS 0.7 08/18/2019 1124   EOSABS 0.1 08/18/2019 1124   BASOSABS 0.1 08/18/2019 1124    SARS-CoV-2 PCR 05/28/2019-negative  Chest Imaging- films reviewed: CXR, 2 view 08/18/2019-normal CXR  CXR, 2 view 06/14/2019-normal  CTA chest 10/19/2016- patulous, dilated esophagus.  No significant mediastinal or hilar adenopathy.  No PE.  Normal lungs.  Pulmonary Functions Testing Results: No flowsheet data found.        Assessment & Plan:     ICD-10-CM   1. Cough  R05   2. Gastroesophageal reflux disease, unspecified whether esophagitis present  K21.9     Chronic cough with associated shortness of breath - based on CT scan I think she is very high risk for GERD despite her lack of heartburn, and improvement with GERD dietary modifications and omeprazole support this. -Continue omeprazole 20 mg daily.  If her symptoms are able to get to the point of being fully controlled, ideally we could switch her to Pepcid twice daily to avoid the long-term risks of osteoporosis associated with chronic PPI use. -Continue GERD dietary modifications-she has done a great job so far of implementing these changes.   -Maintain a healthy weight -Up-to-date on seasonal flu vaccine -Continue social distancing, mask wearing, handwashing per COVID-19 recommendations.  She had questions regarding the vaccine, which I feel is likely going to prove to be safe and beneficial.  RTC in about 3 months.    Current Outpatient Medications:  .  albuterol (VENTOLIN HFA) 108 (90 Base) MCG/ACT inhaler, Inhale 1-2 puffs into the lungs every 6 (six) hours as needed for wheezing or shortness of breath., Disp: 8 Wright, Rfl: 0 .  ALTAVERA 0.15-30 MG-MCG tablet, Take 1 tablet by mouth daily., Disp: , Rfl:  .  beclomethasone (QVAR REDIHALER) 40 MCG/ACT inhaler, Inhale 1 puff into the lungs 2 (two) times daily., Disp: 10.6 Wright, Rfl: 0 .  buPROPion (WELLBUTRIN SR) 150 MG 12 hr tablet,  Take 150 mg by mouth every morning., Disp: , Rfl:  .  hydrOXYzine (ATARAX/VISTARIL) 10 MG tablet, Take 10 mg by mouth 3 (three) times daily as needed., Disp: , Rfl:  .  omeprazole (PRILOSEC) 20 MG capsule, Take 1 capsule (20 mg total) by mouth daily., Disp: 30 capsule, Rfl: 11 .  spironolactone (ALDACTONE) 50 MG tablet, Take 50 mg by mouth daily., Disp: , Rfl:  .  acetaminophen (TYLENOL) 500 MG tablet, Take 500 mg by mouth every 6 (six) hours as needed., Disp: , Rfl:  .  benzonatate (TESSALON) 200 MG capsule, Take 1 capsule (200 mg total) by mouth 3 (three) times daily as needed for cough., Disp: 30 capsule, Rfl: 1 .  predniSONE (DELTASONE) 10 MG tablet, Take 1 tablet (10 mg total) by mouth daily with breakfast., Disp: 3 tablet, Rfl: 0   Julian Hy, DO [facility] 09/09/2019 3:56 PM

## 2019-09-20 ENCOUNTER — Ambulatory Visit: Payer: BLUE CROSS/BLUE SHIELD | Admitting: Family

## 2019-09-20 DIAGNOSIS — F4310 Post-traumatic stress disorder, unspecified: Secondary | ICD-10-CM | POA: Diagnosis not present

## 2019-09-23 DIAGNOSIS — K219 Gastro-esophageal reflux disease without esophagitis: Secondary | ICD-10-CM

## 2019-09-23 DIAGNOSIS — R059 Cough, unspecified: Secondary | ICD-10-CM

## 2019-09-23 DIAGNOSIS — R05 Cough: Secondary | ICD-10-CM

## 2019-09-23 NOTE — Telephone Encounter (Signed)
Per Dr Carlis AbbottCarlis Abbott, Venita Sheffield, DO sent to Rosana Berger, CMA        Magda Paganini, can we order a barium esophagram? It would be the best way to try to find out if this is an esophageal spasm causing this. It has limitations and isn't a perfect test, but should be the best option to try to see.   LPC   Previous Messages    I have ordered the test- pending sig, awaiting okay from pt before we sign

## 2019-09-23 NOTE — Telephone Encounter (Signed)
Dr Carlis Abbott, please advise on pt email thanks:  Logan Bores sent to [facility]  Phone Number: [phone]        Hi Dr. Carlis Abbott,   You requested that I let you know if I have another episode that appear to be some type of spasm. I've been having one this morning. From about 9:45-11:30, feels like I can't catch my breath and there is something wrapped around my chest and throat. No fever, but the suffocating feeling does cause a cough. The Albuterol inhaler is having no effect.   I had been doing well. Taking my medication daily and using the am/pm inhaler. My reaction from having that soda on weekend mornings has reduced a lot. I haven't had a cough or shortness of breath. I have only used the albuterol inhaler just once since I last saw you. Looking at the calendar, I guess that wasn't even that long ago.   How should I proceed?  Merleen Nicely

## 2019-09-24 NOTE — Telephone Encounter (Signed)
Order placed for the barium esophagram as pt was fine with this taking place.nothing further needed.

## 2019-10-01 ENCOUNTER — Other Ambulatory Visit: Payer: Self-pay

## 2019-10-01 ENCOUNTER — Other Ambulatory Visit: Payer: Self-pay | Admitting: Critical Care Medicine

## 2019-10-01 ENCOUNTER — Ambulatory Visit (HOSPITAL_COMMUNITY)
Admission: RE | Admit: 2019-10-01 | Discharge: 2019-10-01 | Disposition: A | Discharge status: Home / Self Care | Payer: BLUE CROSS/BLUE SHIELD | Source: Ambulatory Visit | Attending: Critical Care Medicine | Admitting: Critical Care Medicine

## 2019-10-01 DIAGNOSIS — K219 Gastro-esophageal reflux disease without esophagitis: Secondary | ICD-10-CM

## 2019-10-01 DIAGNOSIS — R059 Cough, unspecified: Secondary | ICD-10-CM

## 2019-10-01 DIAGNOSIS — R05 Cough: Secondary | ICD-10-CM | POA: Insufficient documentation

## 2019-10-01 NOTE — Progress Notes (Signed)
ATC the pt- there was no answer and her VM was full so unable to leave msg

## 2019-10-01 NOTE — Progress Notes (Signed)
Referral placed to [facility] for evaluation of GERD.   Julian Hy, DO 10/01/19 10:49 AM [facility]

## 2019-10-06 NOTE — Telephone Encounter (Signed)
Received message from patient stating that the sample of Qvar she received has stopped working despite still having medication in it. She wanted to know if she needed to stop using it or have a refill sent in for her. Received the following reply in regards to if it actually helped her:   "Yes, I have noticed a difference. I don't know if it was because the omeprazole and diet changes finally took effect at the same time I started the Qvar, or if it was the Qvar itself. My cough almost completely stopped, but I still am having what appear to be spasms on occasion. Dr. Loletta Specter set me up for a barium swallow test last week, showing that I for sure have acid reflux, but nothing indicating why I might be having the spasms. She then referred me to a gastroenterologist that I will be seeing on Jan. 6th.  I don't think I have noticed any side effects."  Dr. Carlis Abbott, please advise. Thanks!

## 2019-10-11 ENCOUNTER — Ambulatory Visit (HOSPITAL_COMMUNITY)
Admission: EM | Admit: 2019-10-11 | Discharge: 2019-10-11 | Disposition: A | Discharge status: Home / Self Care | Payer: BLUE CROSS/BLUE SHIELD | Attending: Family Medicine | Admitting: Family Medicine

## 2019-10-11 ENCOUNTER — Telehealth: Payer: Self-pay

## 2019-10-11 ENCOUNTER — Encounter (HOSPITAL_COMMUNITY): Payer: Self-pay

## 2019-10-11 ENCOUNTER — Other Ambulatory Visit: Payer: Self-pay

## 2019-10-11 DIAGNOSIS — Z20828 Contact with and (suspected) exposure to other viral communicable diseases: Secondary | ICD-10-CM | POA: Diagnosis not present

## 2019-10-11 DIAGNOSIS — J029 Acute pharyngitis, unspecified: Secondary | ICD-10-CM

## 2019-10-11 DIAGNOSIS — Z20822 Contact with and (suspected) exposure to covid-19: Secondary | ICD-10-CM

## 2019-10-11 LAB — POC SARS CORONAVIRUS 2 AG: SARS Coronavirus 2 Ag: NEGATIVE

## 2019-10-11 LAB — POCT RAPID STREP A: Streptococcus, Group A Screen (Direct): NEGATIVE

## 2019-10-11 LAB — POC SARS CORONAVIRUS 2 AG -  ED: SARS Coronavirus 2 Ag: NEGATIVE

## 2019-10-11 NOTE — ED Provider Notes (Signed)
[place]    CSN: [id number] Arrival date & time: 10/11/19  1336      History   Chief Complaint Chief Complaint  Patient presents with  . Sore Throat    HPI Darlene Wright is a 28 y.o. female.   HPI  Patient is here for sore throat.  It started yesterday.  She is here for testing.  No runny or stuffy nose.  No shortness of breath.  No sweats chills or fever.  No change of taste or smell No known exposure to illness.  No known exposure to coronavirus or strep throat  Past Medical History:  Diagnosis Date  . Anxiety   . Fibroadenoma of both breasts   . PCOS (polycystic ovarian syndrome)     Patient Active Problem List   Diagnosis Date Noted  . BREAST MASS, LEFT 05/08/2009    Past Surgical History:  Procedure Laterality Date  . WISDOM TOOTH EXTRACTION    . WISDOM TOOTH EXTRACTION      OB History    Gravida  0   Para  0   Term  0   Preterm  0   AB  0   Living  0     SAB  0   TAB  0   Ectopic  0   Multiple  0   Live Births  0            Home Medications    Prior to Admission medications   Medication Sig Start Date End Date Taking? Authorizing Provider  acetaminophen (TYLENOL) 500 MG tablet Take 500 mg by mouth every 6 (six) hours as needed.    [provider]  albuterol (VENTOLIN HFA) 108 (90 Base) MCG/ACT inhaler Inhale 1-2 puffs into the lungs every 6 (six) hours as needed for wheezing or shortness of breath. 06/30/19   Wieters, Hallie C, PA-C  ALTAVERA 0.15-30 MG-MCG tablet Take 1 tablet by mouth daily. 06/19/19   [provider]  beclomethasone (QVAR REDIHALER) 40 MCG/ACT inhaler Inhale 1 puff into the lungs 2 (two) times daily. 08/18/19   Parrett, Fonnie Mu, NP  buPROPion (WELLBUTRIN SR) 150 MG 12 hr tablet Take 150 mg by mouth every morning. 07/05/19   [provider]  hydrOXYzine (ATARAX/VISTARIL) 10 MG tablet Take 10 mg by mouth 3 (three) times daily as needed.    [provider]   omeprazole (PRILOSEC) 20 MG capsule Take 1 capsule (20 mg total) by mouth daily. 08/12/19   Julian Hy, DO  spironolactone (ALDACTONE) 50 MG tablet Take 50 mg by mouth daily. 06/17/19   [provider]  Levonorgestrel ([name] IU) by Intrauterine route.  06/30/19  [provider]    Family History Family History  Problem Relation Age of Onset  . Hypertension Mother   . Alcohol abuse Mother   . Hypertension Father   . Alcohol abuse Father   . Diabetes Maternal Grandmother   . Hypertension Maternal Grandmother   . Hypertension Maternal Grandfather   . Hypertension Paternal Grandmother   . Leukemia Paternal Grandfather   . Diabetes Paternal Grandfather   . Hypertension Paternal Grandfather   . Polycystic ovary syndrome Sister   . Post-traumatic stress disorder Sister   . Bronchitis Sister   . Pneumonia Sister     Social History Social History   Tobacco Use  . Smoking status: Passive Smoke Exposure - Never Smoker  . Smokeless tobacco: Never Used  Substance Use Topics  . Alcohol use:  Yes    Comment: social  . Drug use: No     Allergies   Patient has no known allergies.   Review of Systems Review of Systems  Constitutional: Negative for chills and fever.  HENT: Positive for sore throat. Negative for congestion and hearing loss.   Eyes: Negative for pain.  Respiratory: Negative for cough and shortness of breath.   Cardiovascular: Negative for chest pain and leg swelling.  Gastrointestinal: Negative for abdominal pain, constipation and diarrhea.  Genitourinary: Negative for dysuria and frequency.  Musculoskeletal: Negative for myalgias.  Neurological: Negative for dizziness, seizures and headaches.  Psychiatric/Behavioral: The patient is not nervous/anxious.      Physical Exam Triage Vital Signs ED Triage Vitals  Enc Vitals Group     BP 10/11/19 1607 131/85     Pulse -- 88     Resp 10/11/19 1607 16     Temp 10/11/19 1607 98.6 F (37 C)      Temp Source 10/11/19 1607 Oral     SpO2 10/11/19 1607 98 %     Weight 10/11/19 1610 152 lb (68.9 kg)     Height --      Head Circumference --      Peak Flow --      Pain Score 10/11/19 1610 0     Pain Loc --      Pain Edu? --      Excl. in [place]? --    No data found.  Updated Vital Signs BP 131/85 (BP Location: Right Arm)   Temp 98.6 F (37 C) (Oral)   Resp 16   Wt 68.9 kg   SpO2 98%   BMI 25.69 kg/m     Physical Exam Constitutional:      General: She is not in acute distress.    Appearance: She is well-developed and normal weight. She is not toxic-appearing.  HENT:     Head: Normocephalic and atraumatic.     Right Ear: Tympanic membrane and ear canal normal.     Left Ear: Tympanic membrane and ear canal normal.     Nose: No congestion.     Mouth/Throat:     Pharynx: Posterior oropharyngeal erythema present. No oropharyngeal exudate or uvula swelling.     Tonsils: No tonsillar exudate or tonsillar abscesses. 1+ on the right. 1+ on the left.  Eyes:     Conjunctiva/sclera: Conjunctivae normal.     Pupils: Pupils are equal, round, and reactive to light.  Cardiovascular:     Rate and Rhythm: Normal rate.     Heart sounds: Normal heart sounds.  Pulmonary:     Effort: Pulmonary effort is normal. No respiratory distress.     Breath sounds: Normal breath sounds.  Abdominal:     General: There is no distension.     Palpations: Abdomen is soft.  Musculoskeletal:        General: Normal range of motion.     Cervical back: Normal range of motion.  Lymphadenopathy:     Cervical: Cervical adenopathy present.  Skin:    General: Skin is warm and dry.  Neurological:     General: No focal deficit present.     Mental Status: She is alert.  Psychiatric:        Mood and Affect: Mood normal.        Behavior: Behavior normal.      UC Treatments / Results  Labs (all labs ordered are listed, but only abnormal results are displayed) Labs Reviewed  CULTURE, GROUP A STREP ([place])   NOVEL CORONAVIRUS, NAA (HOSP ORDER, SEND-OUT TO REF LAB; TAT 18-24 HRS)  POC SARS CORONAVIRUS 2 AG -  ED  POCT RAPID STREP A  POC SARS CORONAVIRUS 2 AG    EKG   Radiology No results found.  Procedures Procedures (including critical care time)  Medications Ordered in UC Medications - No data to display  Initial Impression / Assessment and Plan / UC Course  I have reviewed the triage vital signs and the nursing notes.  Pertinent labs & imaging results that were available during my care of the patient were reviewed by me and considered in my medical decision making (see chart for details).    Rapid strep and rapid coronavirus testing are both negative.  Confirmation sent to laboratory.  Patient is advised to quarantine until she gets her test results.  All questions answered f Final Clinical Impressions(s) / UC Diagnoses   Final diagnoses:  Sore throat  Suspected COVID-19 virus infection     Discharge Instructions     QUARANTINE UNTIL TEST RESULT AVAILABLE TEST RESULTS ARE FOUND IN [facility]   ED Prescriptions    None     PDMP not reviewed this encounter.   Raylene Everts, MD 10/11/19 2016

## 2019-10-11 NOTE — Telephone Encounter (Signed)
Not due to GERD. I would contact PCP to set up for throat culture.

## 2019-10-11 NOTE — Discharge Instructions (Signed)
QUARANTINE UNTIL TEST RESULT AVAILABLE TEST RESULTS ARE FOUND IN [facility]

## 2019-10-11 NOTE — Telephone Encounter (Signed)
Spoke with patient and she has been informed she will need to be seen at an Urgent Care. Patient voiced understanding.

## 2019-10-11 NOTE — Telephone Encounter (Signed)
Beth, please see pt's mychart message and advise recs for pt. Please advise if pt should contact PCP in regards to this. Thanks!

## 2019-10-11 NOTE — ED Triage Notes (Signed)
Pt states she has a sore throat. Pt states this started last Saturday. Pt states she has been using salt water.

## 2019-10-13 LAB — NOVEL CORONAVIRUS, NAA (HOSP ORDER, SEND-OUT TO REF LAB; TAT 18-24 HRS): SARS-CoV-2, NAA: NOT DETECTED

## 2019-10-14 LAB — CULTURE, GROUP A STREP (THRC)

## 2019-10-25 DIAGNOSIS — F4310 Post-traumatic stress disorder, unspecified: Secondary | ICD-10-CM | POA: Diagnosis not present

## 2019-10-26 ENCOUNTER — Other Ambulatory Visit: Payer: Self-pay

## 2019-10-26 ENCOUNTER — Encounter: Payer: Self-pay | Admitting: Family

## 2019-10-26 ENCOUNTER — Ambulatory Visit (INDEPENDENT_AMBULATORY_CARE_PROVIDER_SITE_OTHER): Payer: BLUE CROSS/BLUE SHIELD | Admitting: Family

## 2019-10-26 VITALS — BP 106/68 | HR 73 | Temp 98.2°F | Ht 64.5 in | Wt 157.6 lb

## 2019-10-26 DIAGNOSIS — F419 Anxiety disorder, unspecified: Secondary | ICD-10-CM

## 2019-10-26 DIAGNOSIS — K219 Gastro-esophageal reflux disease without esophagitis: Secondary | ICD-10-CM

## 2019-10-26 DIAGNOSIS — E282 Polycystic ovarian syndrome: Secondary | ICD-10-CM | POA: Diagnosis not present

## 2019-10-26 DIAGNOSIS — F329 Major depressive disorder, single episode, unspecified: Secondary | ICD-10-CM

## 2019-10-26 DIAGNOSIS — F32A Depression, unspecified: Secondary | ICD-10-CM

## 2019-10-26 DIAGNOSIS — R0989 Other specified symptoms and signs involving the circulatory and respiratory systems: Secondary | ICD-10-CM

## 2019-10-26 NOTE — Progress Notes (Signed)
Darlene Wright is a 29 y.o. female with the following history as recorded in Epic[facility]:  Patient Active Problem List   Diagnosis Date Noted  . Polycystic ovarian syndrome 07/21/2018  . BREAST MASS, LEFT 05/08/2009    Current Outpatient Medications  Medication Sig Dispense Refill  . albuterol (VENTOLIN HFA) 108 (90 Base) MCG/ACT inhaler Inhale 1-2 puffs into the lungs every 6 (six) hours as needed for wheezing or shortness of breath. 8 g 0  . ALTAVERA 0.15-30 MG-MCG tablet Take 1 tablet by mouth daily.    Marland Kitchen buPROPion (WELLBUTRIN SR) 150 MG 12 hr tablet Take 150 mg by mouth every morning.    . hydrOXYzine (ATARAX/VISTARIL) 10 MG tablet Take 10 mg by mouth 3 (three) times daily as needed.    Marland Kitchen omeprazole (PRILOSEC) 20 MG capsule Take 1 capsule (20 mg total) by mouth daily. 30 capsule 11  . spironolactone (ALDACTONE) 50 MG tablet Take 50 mg by mouth daily.     No current facility-administered medications for this visit.    Allergies: Patient has no known allergies.  Past Medical History:  Diagnosis Date  . Anxiety   . Fibroadenoma of both breasts   . PCOS (polycystic ovarian syndrome)     Past Surgical History:  Procedure Laterality Date  . WISDOM TOOTH EXTRACTION    . WISDOM TOOTH EXTRACTION      Family History  Problem Relation Age of Onset  . Hypertension Mother   . Alcohol abuse Mother   . Hypertension Father   . Alcohol abuse Father   . Diabetes Maternal Grandmother   . Hypertension Maternal Grandmother   . Hypertension Maternal Grandfather   . Hypertension Paternal Grandmother   . Leukemia Paternal Grandfather   . Diabetes Paternal Grandfather   . Hypertension Paternal Grandfather   . Polycystic ovary syndrome Sister   . Post-traumatic stress disorder Sister   . Bronchitis Sister   . Pneumonia Sister     Social History   Tobacco Use  . Smoking status: Passive Smoke Exposure - Never Smoker  . Smokeless tobacco: Never Used  Substance Use Topics  . Alcohol use:  Yes    Comment: social    Subjective:  Presents today as a new patient; under care of [facility]- working with them for chronic cough/ sensation of air hunger; being treated for GERD by pulmonology; has PCOS- under care of GYN- managing Spironolactone, Wellbutrin SR, OCP;  Scheduled to see GI tomorrow; does have history of panic attacks but notes that the air hunger feels very different than her normal panic attacks; Sees a counselor at least 1-2 x per month;     Objective:  Vitals:   10/26/19 0927  BP: 106/68  Pulse: 73  Temp: 98.2 F (36.8 C)  TempSrc: Oral  SpO2: 95%  Weight: 157 lb 9.6 oz (71.5 kg)  Height: 5' 4.5" (1.638 m)    General: Well developed, well nourished, in no acute distress  Skin : Warm and dry.  Head: Normocephalic and atraumatic  Eyes: Sclera and conjunctiva clear; pupils round and reactive to light; extraocular movements intact  Ears: External normal; canals clear; tympanic membranes normal  Oropharynx: Pink, supple. No suspicious lesions  Neck: Supple without thyromegaly, adenopathy  Lungs: Respirations unlabored; clear to auscultation bilaterally without wheeze, rales, rhonchi  CVS exam: normal rate and regular rhythm.  Neurologic: Alert and oriented; speech intact; face symmetrical; moves all extremities well; CNII-XII intact without focal deficit   Assessment:  1. Polycystic ovarian syndrome  2. Air hunger   3. Gastroesophageal reflux disease, unspecified whether esophagitis present   4. Anxiety and depression     Plan:  1. Continue with GYN; 2. ? Etiology; keep planned follow-up with GI for tomorrow; 3. Continue Prilosec as prescribed by pulmonology; 4. Continue with therapist; per patient, medications are effective that GYN is prescribing for her.  Follow-up as needed otherwise.   No follow-ups on file.  No orders of the defined types were placed in this encounter.   Requested Prescriptions    No prescriptions requested or  ordered in this encounter

## 2019-10-27 ENCOUNTER — Encounter: Payer: Self-pay | Admitting: Gastroenterology

## 2019-10-27 ENCOUNTER — Encounter: Payer: Self-pay | Admitting: Physician Assistant

## 2019-10-27 ENCOUNTER — Ambulatory Visit: Payer: BLUE CROSS/BLUE SHIELD | Admitting: Physician Assistant

## 2019-10-27 VITALS — BP 94/60 | HR 80 | Temp 96.8°F | Ht 64.5 in | Wt 157.4 lb

## 2019-10-27 DIAGNOSIS — K224 Dyskinesia of esophagus: Secondary | ICD-10-CM

## 2019-10-27 DIAGNOSIS — K219 Gastro-esophageal reflux disease without esophagitis: Secondary | ICD-10-CM | POA: Diagnosis not present

## 2019-10-27 DIAGNOSIS — Z1159 Encounter for screening for other viral diseases: Secondary | ICD-10-CM

## 2019-10-27 DIAGNOSIS — R05 Cough: Secondary | ICD-10-CM

## 2019-10-27 DIAGNOSIS — R059 Cough, unspecified: Secondary | ICD-10-CM

## 2019-10-27 MED ORDER — ONDANSETRON HCL 4 MG PO TABS: ORAL_TABLET | ORAL | 0 refills | Status: DC

## 2019-10-27 NOTE — Patient Instructions (Signed)
If you are age 27 or older, your body mass index should be between 23-30. Your Body mass index is 26.6 kg/m. If this is out of the aforementioned range listed, please consider follow up with your Primary Care Provider.  If you are age 58 or younger, your body mass index should be between 19-25. Your Body mass index is 26.6 kg/m. If this is out of the aformentioned range listed, please consider follow up with your Primary Care Provider.   We have sent the following medications to your pharmacy for you to pick up at your convenience:  Zofran 4 mg  You have been scheduled for an endoscopy. Please follow written instructions given to you at your visit today. If you use inhalers (even only as needed), please bring them with you on the day of your procedure.  Due to recent changes in healthcare laws, you may see the results of your imaging and laboratory studies on MyChart before your provider has had a chance to review them.  We understand that in some cases there may be results that are confusing or concerning to you. Not all laboratory results come back in the same time frame and the provider may be waiting for multiple results in order to interpret others.  Please give [place] 48 hours in order for your provider to thoroughly review all the results before contacting the office for clarification of your results.   Thank you for choosing me and [facility]

## 2019-10-27 NOTE — Progress Notes (Signed)
Chief Complaint: GERD, question esophageal spasm  HPI:    Darlene Wright is a 29 year old female with a past medical history as listed below, who was referred to me by Julian Hy, DO for a complaint of reflux and question of esophageal spasm.      Today, patient explains that back in July she started with a cough which was persistent and made associated with some chest pain, she was seen in urgent care and ER multiple times for this and eventually sent to pulmonology who diagnosed her with reflux and started treatment with Omeprazole 20 mg daily.  A week after starting this medication and with dietary changes, "I loved soda", she noticed a decrease in her coughing.  Her complaint now is that at least 1 time a week she will have a feeling like something is "wrapped around my chest and throat and like I cannot catch my breath or swallow".  Tells me she does have anxiety but this is different than feeling panic attacks which she has had in the past.  Can last anywhere from 20 to 30 minutes up to 2 hours and does not seem related to eating or drinking in particular.    Also describes within the past couple of weeks developed some white pustular nodules on the back of her throat and proceeded to the urgent care, she was tested for Covid and strep all of which returned negative.  She was told that likely this was viral.    Social history positive for having a date night with her husband every Friday.    Denies fever, chills, weight loss, change in bowel habits or abdominal pain.  Past Medical History:  Diagnosis Date  . Anxiety   . Fibroadenoma of both breasts   . PCOS (polycystic ovarian syndrome)     Past Surgical History:  Procedure Laterality Date  . WISDOM TOOTH EXTRACTION    . WISDOM TOOTH EXTRACTION      Current Outpatient Medications  Medication Sig Dispense Refill  . albuterol (VENTOLIN HFA) 108 (90 Base) MCG/ACT inhaler Inhale 1-2 puffs into the lungs every 6 (six) hours as needed  for wheezing or shortness of breath. 8 g 0  . ALTAVERA 0.15-30 MG-MCG tablet Take 1 tablet by mouth daily.    Marland Kitchen buPROPion (WELLBUTRIN SR) 150 MG 12 hr tablet Take 150 mg by mouth every morning.    . hydrOXYzine (ATARAX/VISTARIL) 10 MG tablet Take 10 mg by mouth 3 (three) times daily as needed.    Marland Kitchen omeprazole (PRILOSEC) 20 MG capsule Take 1 capsule (20 mg total) by mouth daily. 30 capsule 11  . spironolactone (ALDACTONE) 50 MG tablet Take 50 mg by mouth daily.    . ondansetron (ZOFRAN) 4 MG tablet 1 tablet under the tongue every 4 - 6 hours as needed for nausea 15 tablet 0   No current facility-administered medications for this visit.    Allergies as of 10/27/2019  . (No Known Allergies)    Family History  Problem Relation Age of Onset  . Hypertension Mother   . Alcohol abuse Mother   . Hypertension Father   . Alcohol abuse Father   . Diabetes Maternal Grandmother   . Hypertension Maternal Grandfather   . Hypertension Paternal Grandmother   . Leukemia Paternal Grandfather   . Diabetes Paternal Grandfather   . Hypertension Paternal Grandfather   . Polycystic ovary syndrome Sister   . Post-traumatic stress disorder Sister   . Bronchitis Sister   . Pneumonia  Sister     Social History   Socioeconomic History  . Marital status: Married    Spouse name: Not on file  . Number of children: Not on file  . Years of education: Not on file  . Highest education level: Not on file  Occupational History  . Not on file  Tobacco Use  . Smoking status: Passive Smoke Exposure - Never Smoker  . Smokeless tobacco: Never Used  Substance and Sexual Activity  . Alcohol use: Yes    Comment: social  . Drug use: No  . Sexual activity: Yes    Birth control/protection: I.U.D.    Comment: kyleena placed 02-27-18  Other Topics Concern  . Not on file  Social History Narrative  . Not on file   Social Determinants of Health   Financial Resource Strain:   . Difficulty of Paying Living  Expenses: Not on file  Food Insecurity:   . Worried About Charity fundraiser in the Last Year: Not on file  . Ran Out of Food in the Last Year: Not on file  Transportation Needs:   . Lack of Transportation (Medical): Not on file  . Lack of Transportation (Non-Medical): Not on file  Physical Activity:   . Days of Exercise per Week: Not on file  . Minutes of Exercise per Session: Not on file  Stress:   . Feeling of Stress : Not on file  Social Connections:   . Frequency of Communication with Friends and Family: Not on file  . Frequency of Social Gatherings with Friends and Family: Not on file  . Attends Religious Services: Not on file  . Active Member of Clubs or Organizations: Not on file  . Attends Archivist Meetings: Not on file  . Marital Status: Not on file  Intimate Partner Violence:   . Fear of Current or Ex-Partner: Not on file  . Emotionally Abused: Not on file  . Physically Abused: Not on file  . Sexually Abused: Not on file    Review of Systems:    Constitutional: No weight loss, fever or chills Skin: No rash  Cardiovascular: No chest pain Respiratory: No SOB  Gastrointestinal: See HPI and otherwise negative Genitourinary: No dysuria  Neurological: No headache Musculoskeletal: No new muscle or joint pain Hematologic: No bleeding  Psychiatric: +anxiety   Physical Exam:  Vital signs: BP 94/60   Pulse 80   Temp (!) 96.8 F (36 C)   Ht 5' 4.5" (1.638 m)   Wt 157 lb 6 oz (71.4 kg)   BMI 26.60 kg/m   Constitutional:   Very Pleasant Caucasian female appears to be in NAD, Well developed, Well nourished, alert and cooperative Head:  Normocephalic and atraumatic. Eyes:   PEERL, EOMI. No icterus. Conjunctiva pink. Ears:  Normal auditory acuity. Neck:  Supple Throat: Oral cavity and pharynx without inflammation, swelling or lesion.  Respiratory: Respirations even and unlabored. Lungs clear to auscultation bilaterally.   No wheezes, crackles, or rhonchi.   Cardiovascular: Normal S1, S2. No MRG. Regular rate and rhythm. No peripheral edema, cyanosis or pallor.  Gastrointestinal:  Soft, nondistended, nontender. No rebound or guarding. Normal bowel sounds. No appreciable masses or hepatomegaly. Rectal:  Not performed.  Msk:  Symmetrical without gross deformities. Without edema, no deformity or joint abnormality.  Neurologic:  Alert and  oriented x4;  grossly normal neurologically.  Skin:   Dry and intact without significant lesions or rashes. Psychiatric: Demonstrates good judgement and reason without abnormal affect or  behaviors.  MOST RECENT LABS AND IMAGING: CBC    Component Value Date/Time   WBC 6.8 08/18/2019 1124   RBC 4.86 08/18/2019 1124   HGB 14.3 08/18/2019 1124   HCT 41.6 08/18/2019 1124   PLT 254.0 08/18/2019 1124   MCV 85.7 08/18/2019 1124   MCH 29.3 06/14/2019 1147   MCHC 34.3 08/18/2019 1124   RDW 12.1 08/18/2019 1124   LYMPHSABS 1.9 08/18/2019 1124   MONOABS 0.7 08/18/2019 1124   EOSABS 0.1 08/18/2019 1124   BASOSABS 0.1 08/18/2019 1124    CMP     Component Value Date/Time   NA 139 06/14/2019 1147   K 3.8 06/14/2019 1147   CL 108 06/14/2019 1147   CO2 21 (L) 06/14/2019 1147   GLUCOSE 93 06/14/2019 1147   BUN 9 06/14/2019 1147   CREATININE 0.81 06/14/2019 1147   CALCIUM 8.8 (L) 06/14/2019 1147   PROT 8.0 06/19/2015 1340   ALBUMIN 3.7 06/19/2015 1340   AST 27 06/19/2015 1340   ALT 17 06/19/2015 1340   ALKPHOS 64 06/19/2015 1340   BILITOT 0.8 06/19/2015 1340   GFRNONAA >60 06/14/2019 1147   GFRAA >60 06/14/2019 1147    Assessment: 1.  Esophageal spasm?:  Patient describes a tightness around her throat and chest which makes it hard to swallow or breathe which occurs once a week and can last for up to 2 hours, does have history of anxiety but this is different than pain attacks per patient; consider esophageal spasm versus other 2.  GERD: As diagnosed by pulmonology 3.  Cough: Related to above, seen by  pulmonology who started her on Omeprazole and this is much better now  Plan: 1.  Scheduled the patient for an EGD with Dr. Silverio Decamp in the [facility].  Did discuss risks, benefits, limitations and alternatives and patient agrees to proceed.  Patient will be Covid tested 2 days prior to time of exam. 2.  Pending results from above discussed with patient that she may benefit from esophageal manometry study for further evaluation of possible esophageal spasms. 3.  Continue Omeprazole 20 mg daily 4.  Patient requested some Zofran to have on hand to take before EGD as she is sometimes nauseous with anesthesia.  Prescribed Zofran 4 mg ODT #15 with no refills. 5.  Patient to follow in clinic per recommendations from Dr. Silverio Decamp after time of procedure.  Ellouise Newer, PA-C [facility] 10/27/2019, 11:15 AM  Cc: Julian Hy, DO

## 2019-10-29 ENCOUNTER — Ambulatory Visit (INDEPENDENT_AMBULATORY_CARE_PROVIDER_SITE_OTHER): Payer: BLUE CROSS/BLUE SHIELD

## 2019-10-29 DIAGNOSIS — Z1159 Encounter for screening for other viral diseases: Secondary | ICD-10-CM

## 2019-11-01 ENCOUNTER — Other Ambulatory Visit: Payer: Self-pay

## 2019-11-01 ENCOUNTER — Encounter: Payer: Self-pay | Admitting: Gastroenterology

## 2019-11-01 ENCOUNTER — Ambulatory Visit (AMBULATORY_SURGERY_CENTER): Payer: BLUE CROSS/BLUE SHIELD | Admitting: Gastroenterology

## 2019-11-01 VITALS — BP 100/66 | HR 58 | Temp 98.2°F | Resp 17 | Ht 64.0 in | Wt 152.0 lb

## 2019-11-01 DIAGNOSIS — K2950 Unspecified chronic gastritis without bleeding: Secondary | ICD-10-CM | POA: Diagnosis not present

## 2019-11-01 DIAGNOSIS — K219 Gastro-esophageal reflux disease without esophagitis: Secondary | ICD-10-CM | POA: Diagnosis not present

## 2019-11-01 DIAGNOSIS — K2970 Gastritis, unspecified, without bleeding: Secondary | ICD-10-CM

## 2019-11-01 DIAGNOSIS — K224 Dyskinesia of esophagus: Secondary | ICD-10-CM

## 2019-11-01 LAB — SARS CORONAVIRUS 2 (TAT 6-24 HRS): SARS Coronavirus 2: NEGATIVE

## 2019-11-01 MED ORDER — SODIUM CHLORIDE 0.9 % IV SOLN: 500.0000 mL | Freq: Once | INTRAVENOUS | Status: DC

## 2019-11-01 NOTE — Op Note (Signed)
[facility] Patient Name: Darlene Wright Procedure Date: 11/01/2019 9:35 AM MRN: [id number] Endoscopist: Mauri Pole , MD Age: 29 Referring MD:  Date of Birth: 1991-01-21 Gender: Female Account #: [id number] Procedure:                Upper GI endoscopy Indications:              Esophageal reflux symptoms that persist despite                            appropriate therapy Medicines:                Monitored Anesthesia Care Procedure:                Pre-Anesthesia Assessment:                           - Prior to the procedure, a History and Physical                            was performed, and patient medications and                            allergies were reviewed. The patient's tolerance of                            previous anesthesia was also reviewed. The risks                            and benefits of the procedure and the sedation                            options and risks were discussed with the patient.                            All questions were answered, and informed consent                            was obtained. Prior Anticoagulants: The patient has                            taken no previous anticoagulant or antiplatelet                            agents. ASA Grade Assessment: II - A patient with                            mild systemic disease. After reviewing the risks                            and benefits, the patient was deemed in                            satisfactory condition to undergo the procedure.  After obtaining informed consent, the endoscope was                            passed under direct vision. Throughout the                            procedure, the patient's blood pressure, pulse, and                            oxygen saturations were monitored continuously. The                            Endoscope was introduced through the mouth, and                            advanced to the second part of  duodenum. The upper                            GI endoscopy was accomplished without difficulty.                            The patient tolerated the procedure well. Scope In: Scope Out: Findings:                 The Z-line was regular and was found 38 cm from the                            incisors.                           No gross lesions were noted in the entire                            esophagus. Biopsies were obtained from the proximal                            and distal esophagus with cold forceps for                            histology of suspected eosinophilic esophagitis.                           Patchy mild inflammation characterized by                            congestion (edema), erosions and erythema was found                            in the gastric antrum and in the prepyloric region                            of the stomach. Biopsies were taken with a cold  forceps for Helicobacter pylori testing.                           The examined duodenum was normal. Complications:            No immediate complications. Estimated Blood Loss:     Estimated blood loss was minimal. Impression:               - Z-line regular, 38 cm from the incisors.                           - No gross lesions in esophagus. Biopsied.                           - Gastritis. Biopsied.                           - Normal examined duodenum. Recommendation:           - Patient has a contact number available for                            emergencies. The signs and symptoms of potential                            delayed complications were discussed with the                            patient. Return to normal activities tomorrow.                            Written discharge instructions were provided to the                            patient.                           - Resume previous diet.                           - Continue present medications.                            - Await pathology results.                           - Follow an antireflux regimen.                           - Return to GI office at the next available                            appointment to discuss esophageal manometry and 24                            hr pH study off PPI if continues to have persistent  symptoms. Mauri Pole, MD 11/01/2019 9:55:12 AM This report has been signed electronically.

## 2019-11-01 NOTE — Progress Notes (Signed)
Pt's states no medical or surgical changes since previsit or office visit.  Temp JB VS DT  

## 2019-11-01 NOTE — Progress Notes (Signed)
Report to PACU, RN, vss, BBS= Clear.  

## 2019-11-01 NOTE — Progress Notes (Signed)
Called to room to assist during endoscopic procedure.  Patient ID and intended procedure confirmed with present staff. Received instructions for my participation in the procedure from the performing physician.  

## 2019-11-01 NOTE — Progress Notes (Signed)
Reviewed and agree with documentation and assessment and plan. K. Veena Bertie Mcconathy , MD   

## 2019-11-01 NOTE — Patient Instructions (Signed)
Information on gastritis given to you.  Await pathology results.  Follow an antireflux regimen.  Return to GI office at the next available appointment if symptoms persist.  YOU HAD AN ENDOSCOPIC PROCEDURE TODAY AT [facility]:   Refer to the procedure report that was given to you for any specific questions about what was found during the examination.  If the procedure report does not answer your questions, please call your gastroenterologist to clarify.  If you requested that your care partner not be given the details of your procedure findings, then the procedure report has been included in a sealed envelope for you to review at your convenience later.  YOU SHOULD EXPECT: Some feelings of bloating in the abdomen. Passage of more gas than usual.  Walking can help get rid of the air that was put into your GI tract during the procedure and reduce the bloating. If you had a lower endoscopy (such as a colonoscopy or flexible sigmoidoscopy) you may notice spotting of blood in your stool or on the toilet paper. If you underwent a bowel prep for your procedure, you may not have a normal bowel movement for a few days.  Please Note:  You might notice some irritation and congestion in your nose or some drainage.  This is from the oxygen used during your procedure.  There is no need for concern and it should clear up in a day or so.  SYMPTOMS TO REPORT IMMEDIATELY:   Following upper endoscopy (EGD)  Vomiting of blood or coffee ground material  New chest pain or pain under the shoulder blades  Painful or persistently difficult swallowing  New shortness of breath  Fever of 100F or higher  Black, tarry-looking stools  For urgent or emergent issues, a gastroenterologist can be reached at any hour by calling [phone].   DIET:  We do recommend a small meal at first, but then you may proceed to your regular diet.  Drink plenty of fluids but you should avoid alcoholic beverages for  24 hours.  ACTIVITY:  You should plan to take it easy for the rest of today and you should NOT DRIVE or use heavy machinery until tomorrow (because of the sedation medicines used during the test).    FOLLOW UP: Our staff will call the number listed on your records 48-72 hours following your procedure to check on you and address any questions or concerns that you may have regarding the information given to you following your procedure. If we do not reach you, we will leave a message.  We will attempt to reach you two times.  During this call, we will ask if you have developed any symptoms of COVID 19. If you develop any symptoms (ie: fever, flu-like symptoms, shortness of breath, cough etc.) before then, please call [phone].  If you test positive for Covid 19 in the 2 weeks post procedure, please call and report this information to [place].    If any biopsies were taken you will be contacted by phone or by letter within the next 1-3 weeks.  Please call us at [phone] if you have not heard about the biopsies in 3 weeks.    SIGNATURES/CONFIDENTIALITY: You and/or your care partner have signed paperwork which will be entered into your electronic medical record.  These signatures attest to the fact that that the information above on your After Visit Summary has been reviewed and is understood.  Full responsibility of the confidentiality of this discharge information  lies with you and/or your care-partner.

## 2019-11-02 ENCOUNTER — Telehealth: Payer: Self-pay

## 2019-11-02 NOTE — Telephone Encounter (Signed)
Dr Silverio Decamp requests the patient be scheduled for a follow up appointment for the esophageal reflux. Status post EGD with biopsy. See the procedure note. Called the patient's phone. No answer. Left a message explaining the reason for the call and requesting she call back to schedule her follow up appointment with Dr Silverio Decamp.

## 2019-11-03 ENCOUNTER — Telehealth: Payer: Self-pay

## 2019-11-03 ENCOUNTER — Telehealth: Payer: Self-pay | Admitting: Gastroenterology

## 2019-11-03 ENCOUNTER — Other Ambulatory Visit: Payer: Self-pay

## 2019-11-03 MED ORDER — LIDOCAINE VISCOUS HCL 2 % MT SOLN: OROMUCOSAL | 0 refills | Status: DC

## 2019-11-03 NOTE — Telephone Encounter (Signed)
Spoke with patient. States she is eating only soft foods. She is taking small bites  and chewing well. She feels the pain after she swallows. States "brief delay" then the pain hits. Then it is gone. By the end of the meal, the pain comes more quickly after the swallow. It is a sharp pain at the bra strap line she feels into her back. Thanks

## 2019-11-03 NOTE — Telephone Encounter (Signed)
Called patient back. She has pain when she tries to swallow solid food, last for few seconds when she is swallowing and usually is better with 10 minutes.  Most likely sensitive esophagus at site of proximal esophageal biopsies.  Reassured patient Advised her to continue soft diet for next few days.  Use viscous lidocaine 5 to 10 cc every 4-6 hours as needed, please send prescription to her pharmacy.  Thank you

## 2019-11-03 NOTE — Telephone Encounter (Signed)
  Follow up Call-  Call back number 11/01/2019  Post procedure Call Back phone  # [phone]  Permission to leave phone message Yes  Some recent data might be hidden     Patient questions:  Do you have a fever, pain , or abdominal swelling? Yes.   Pain Score  0 * better now but did have difficulty eating right after  Have you tolerated food without any problems? Yes.    Have you been able to return to your normal activities? Yes.    Do you have any questions about your discharge instructions: Diet   No. Medications  No. Follow up visit  No.  Do you have questions or concerns about your Care? No.  Actions: * If pain score is 4 or above: No action needed, pain <4. 1. Have you developed a fever since your procedure? no  2.   Have you had an respiratory symptoms (SOB or cough) since your procedure? no  3.   Have you tested positive for COVID 19 since your procedure no  4.   Have you had any family members/close contacts diagnosed with the COVID 19 since your procedure?  no   If yes to any of these questions please route to Joylene John, RN and Alphonsa Gin, Therapist, sports.

## 2019-11-08 DIAGNOSIS — F4310 Post-traumatic stress disorder, unspecified: Secondary | ICD-10-CM | POA: Diagnosis not present

## 2019-11-09 ENCOUNTER — Encounter: Payer: Self-pay | Admitting: Gastroenterology

## 2019-11-17 ENCOUNTER — Ambulatory Visit: Payer: BLUE CROSS/BLUE SHIELD | Admitting: Gastroenterology

## 2019-11-17 ENCOUNTER — Encounter: Payer: Self-pay | Admitting: Gastroenterology

## 2019-11-17 VITALS — BP 102/72 | HR 69 | Temp 98.3°F | Ht 65.0 in | Wt 158.0 lb

## 2019-11-17 DIAGNOSIS — K224 Dyskinesia of esophagus: Secondary | ICD-10-CM

## 2019-11-17 DIAGNOSIS — K219 Gastro-esophageal reflux disease without esophagitis: Secondary | ICD-10-CM | POA: Diagnosis not present

## 2019-11-17 NOTE — Patient Instructions (Addendum)
If you are age 27 or older, your body mass index should be between 23-30. Your Body mass index is 26.29 kg/m. If this is out of the aforementioned range listed, please consider follow up with your Primary Care Provider.  If you are age 81 or younger, your body mass index should be between 19-25. Your Body mass index is 26.29 kg/m. If this is out of the aformentioned range listed, please consider follow up with your Primary Care Provider.    Gastroesophageal Reflux Disease, Adult Gastroesophageal reflux (GER) happens when acid from the stomach flows up into the tube that connects the mouth and the stomach (esophagus). Normally, food travels down the esophagus and stays in the stomach to be digested. With GER, food and stomach acid sometimes move back up into the esophagus. You may have a disease called gastroesophageal reflux disease (GERD) if the reflux:  Happens often.  Causes frequent or very bad symptoms.  Causes problems such as damage to the esophagus. When this happens, the esophagus becomes sore and swollen (inflamed). Over time, GERD can make small holes (ulcers) in the lining of the esophagus. What are the causes? This condition is caused by a problem with the muscle between the esophagus and the stomach. When this muscle is weak or not normal, it does not close properly to keep food and acid from coming back up from the stomach. The muscle can be weak because of:  Tobacco use.  Pregnancy.  Having a certain type of hernia (hiatal hernia).  Alcohol use.  Certain foods and drinks, such as coffee, chocolate, onions, and peppermint. What increases the risk? You are more likely to develop this condition if you:  Are overweight.  Have a disease that affects your connective tissue.  Use NSAID medicines. What are the signs or symptoms? Symptoms of this condition include:  Heartburn.  Difficult or painful swallowing.  The feeling of having a lump in the throat.  A bitter  taste in the mouth.  Bad breath.  Having a lot of saliva.  Having an upset or bloated stomach.  Belching.  Chest pain. Different conditions can cause chest pain. Make sure you see your doctor if you have chest pain.  Shortness of breath or noisy breathing (wheezing).  Ongoing (chronic) cough or a cough at night.  Wearing away of the surface of teeth (tooth enamel).  Weight loss. How is this treated? Treatment will depend on how bad your symptoms are. Your doctor may suggest:  Changes to your diet.  Medicine.  Surgery. Follow these instructions at home: Eating and drinking   Follow a diet as told by your doctor. You may need to avoid foods and drinks such as: ? Coffee and tea (with or without caffeine). ? Drinks that contain alcohol. ? Energy drinks and sports drinks. ? Bubbly (carbonated) drinks or sodas. ? Chocolate and cocoa. ? Peppermint and mint flavorings. ? Garlic and onions. ? Horseradish. ? Spicy and acidic foods. These include peppers, chili powder, curry powder, vinegar, hot sauces, and BBQ sauce. ? Citrus fruit juices and citrus fruits, such as oranges, lemons, and limes. ? Tomato-based foods. These include red sauce, chili, salsa, and pizza with red sauce. ? Fried and fatty foods. These include donuts, french fries, potato chips, and high-fat dressings. ? High-fat meats. These include hot dogs, rib eye steak, sausage, ham, and bacon. ? High-fat dairy items, such as whole milk, butter, and cream cheese.  Eat small meals often. Avoid eating large meals.  Avoid drinking  large amounts of liquid with your meals.  Avoid eating meals during the 2-3 hours before bedtime.  Avoid lying down right after you eat.  Do not exercise right after you eat. Lifestyle   Do not use any products that contain nicotine or tobacco. These include cigarettes, e-cigarettes, and chewing tobacco. If you need help quitting, ask your doctor.  Try to lower your stress. If you  need help doing this, ask your doctor.  If you are overweight, lose an amount of weight that is healthy for you. Ask your doctor about a safe weight loss goal. General instructions  Pay attention to any changes in your symptoms.  Take over-the-counter and prescription medicines only as told by your doctor. Do not take aspirin, ibuprofen, or other NSAIDs unless your doctor says it is okay.  Wear loose clothes. Do not wear anything tight around your waist.  Raise (elevate) the head of your bed about 6 inches (15 cm).  Avoid bending over if this makes your symptoms worse.  Keep all follow-up visits as told by your doctor. This is important. Contact a doctor if:  You have new symptoms.  You lose weight and you do not know why.  You have trouble swallowing or it hurts to swallow.  You have wheezing or a cough that keeps happening.  Your symptoms do not get better with treatment.  You have a hoarse voice. Get help right away if:  You have pain in your arms, neck, jaw, teeth, or back.  You feel sweaty, dizzy, or light-headed.  You have chest pain or shortness of breath.  You throw up (vomit) and your throw-up looks like blood or coffee grounds.  You pass out (faint).  Your poop (stool) is bloody or black.  You cannot swallow, drink, or eat. Summary  If a person has gastroesophageal reflux disease (GERD), food and stomach acid move back up into the esophagus and cause symptoms or problems such as damage to the esophagus.  Treatment will depend on how bad your symptoms are.  Follow a diet as told by your doctor.  Take all medicines only as told by your doctor. This information is not intended to replace advice given to you by your health care provider. Make sure you discuss any questions you have with your health care provider. Document Revised: 04/15/2018 Document Reviewed: 04/15/2018 Elsevier Patient Education  2020 [place] can wean off the Omeprazole.   Take every other day for a week, then take every 2 to 3 days for a week, then every 4 to 5 days for a week and then just take as needed.  Thank you, Dr. Silverio Decamp

## 2019-11-17 NOTE — Progress Notes (Addendum)
Darlene Wright    [id number]    1991-04-29  Primary Care Physician:Murray, Marvis Repress, FNP  Referring Physician: Marrian Salvage, [address] [place],  [place] [address]   Chief complaint:  Esophageal spasms  HPI:  29 year old female here for follow-up visit for esophageal spasms.  She is doing overall well, has not had any recent episodes of heartburn or esophageal spasm Denies any nausea, vomiting, abdominal pain, melena or bright red blood per rectum   EGD 11/01/19: Z-line regular at 38cm. No gross lesion in esophagus. Esophageal biopsies negative for eosinophilic esophagitis. Gastritis, negative for H.pylori   Outpatient Encounter Medications as of 11/17/2019  Medication Sig  . albuterol (VENTOLIN HFA) 108 (90 Base) MCG/ACT inhaler Inhale 1-2 puffs into the lungs every 6 (six) hours as needed for wheezing or shortness of breath.  Thana Ates 0.15-30 MG-MCG tablet Take 1 tablet by mouth daily.  Marland Kitchen buPROPion (WELLBUTRIN SR) 150 MG 12 hr tablet Take 150 mg by mouth every morning.  . hydrOXYzine (ATARAX/VISTARIL) 10 MG tablet Take 10 mg by mouth 3 (three) times daily as needed.  . lidocaine (XYLOCAINE) 2 % solution Swallow 54ml to 10 ml as needed every 4 to 6 hours  . omeprazole (PRILOSEC) 20 MG capsule Take 1 capsule (20 mg total) by mouth daily.  . ondansetron (ZOFRAN) 4 MG tablet 1 tablet under the tongue every 4 - 6 hours as needed for nausea (Patient not taking: Reported on 11/01/2019)  . spironolactone (ALDACTONE) 50 MG tablet Take 50 mg by mouth daily.  . [DISCONTINUED] Levonorgestrel ([name] IU) by Intrauterine route.   No facility-administered encounter medications on file as of 11/17/2019.    Allergies as of 11/17/2019  . (No Known Allergies)    Past Medical History:  Diagnosis Date  . Anxiety   . Depression   . Fibroadenoma of both breasts   . GERD (gastroesophageal reflux disease)   . PCOS (polycystic ovarian syndrome)     Past  Surgical History:  Procedure Laterality Date  . WISDOM TOOTH EXTRACTION    . WISDOM TOOTH EXTRACTION      Family History  Problem Relation Age of Onset  . Hypertension Mother   . Alcohol abuse Mother   . Hypertension Father   . Alcohol abuse Father   . Diabetes Maternal Grandmother   . Hypertension Maternal Grandfather   . Hypertension Paternal Grandmother   . Leukemia Paternal Grandfather   . Diabetes Paternal Grandfather   . Hypertension Paternal Grandfather   . Polycystic ovary syndrome Sister   . Post-traumatic stress disorder Sister   . Bronchitis Sister   . Pneumonia Sister   . Colon cancer Neg Hx   . Esophageal cancer Neg Hx   . Rectal cancer Neg Hx   . Stomach cancer Neg Hx     Social History   Socioeconomic History  . Marital status: Married    Spouse name: Not on file  . Number of children: Not on file  . Years of education: Not on file  . Highest education level: Not on file  Occupational History  . Not on file  Tobacco Use  . Smoking status: Passive Smoke Exposure - Never Smoker  . Smokeless tobacco: Never Used  Substance and Sexual Activity  . Alcohol use: Yes    Comment: social  . Drug use: No  . Sexual activity: Yes    Birth control/protection: I.U.D.    Comment: Verdia Kuba  placed 02-27-18  Other Topics Concern  . Not on file  Social History Narrative  . Not on file   Social Determinants of Health   Financial Resource Strain:   . Difficulty of Paying Living Expenses: Not on file  Food Insecurity:   . Worried About Charity fundraiser in the Last Year: Not on file  . Ran Out of Food in the Last Year: Not on file  Transportation Needs:   . Lack of Transportation (Medical): Not on file  . Lack of Transportation (Non-Medical): Not on file  Physical Activity:   . Days of Exercise per Week: Not on file  . Minutes of Exercise per Session: Not on file  Stress:   . Feeling of Stress : Not on file  Social Connections:   . Frequency of  Communication with Friends and Family: Not on file  . Frequency of Social Gatherings with Friends and Family: Not on file  . Attends Religious Services: Not on file  . Active Member of Clubs or Organizations: Not on file  . Attends Archivist Meetings: Not on file  . Marital Status: Not on file  Intimate Partner Violence:   . Fear of Current or Ex-Partner: Not on file  . Emotionally Abused: Not on file  . Physically Abused: Not on file  . Sexually Abused: Not on file      Review of systems: All other review of systems negative except as mentioned in the HPI.   Physical Exam: Vitals:   11/17/19 1532  BP: 102/72  Pulse: 69  Temp: 98.3 F (36.8 C)   Body mass index is 26.29 kg/m.  Data Reviewed:  Reviewed labs, radiology imaging, old records and pertinent past GI work up   Assessment and Plan/Recommendations:  29 year old female with complaints of intermittent heartburn and esophageal spasm EGD with esophageal biopsies negative for eosinophilic esophagitis or erosive esophagitis.  Mild gastritis with gastric biopsies negative for H. Pylori Overall her symptoms are currently well controlled Continue antireflux measures and omeprazole 20 mg daily as needed, slowly wean it as tolerated Discussed potential long-term side effects with chronic PPI use We will hold off esophageal manometry given she has not had any severe episode of esophageal spasm, will reevaluate if she has further episodes Return as needed  This visit required 20 minutes of patient care (this includes precharting, chart review, review of results, face-to-face time used for counseling as well as treatment plan and follow-up. The patient was provided an opportunity to ask questions and all were answered. The patient agreed with the plan and demonstrated an understanding of the instructions.  Damaris Hippo , MD    CC: Marrian Salvage,*

## 2019-11-23 ENCOUNTER — Encounter: Payer: Self-pay | Admitting: Gastroenterology

## 2019-12-16 DIAGNOSIS — F4310 Post-traumatic stress disorder, unspecified: Secondary | ICD-10-CM | POA: Diagnosis not present

## 2020-01-03 ENCOUNTER — Ambulatory Visit: Payer: BLUE CROSS/BLUE SHIELD | Admitting: Family

## 2020-01-03 ENCOUNTER — Other Ambulatory Visit: Payer: Self-pay

## 2020-01-03 ENCOUNTER — Encounter: Payer: Self-pay | Admitting: Family

## 2020-01-03 VITALS — BP 102/64 | HR 80 | Temp 98.1°F | Ht 65.0 in | Wt 155.6 lb

## 2020-01-03 DIAGNOSIS — M79621 Pain in right upper arm: Secondary | ICD-10-CM | POA: Diagnosis not present

## 2020-01-03 NOTE — Progress Notes (Signed)
Darlene Wright is a 29 y.o. female with the following history as recorded in EpicCare:  Patient Active Problem List   Diagnosis Date Noted  . Polycystic ovarian syndrome 07/21/2018  . BREAST MASS, LEFT 05/08/2009    Current Outpatient Medications  Medication Sig Dispense Refill  . albuterol (VENTOLIN HFA) 108 (90 Base) MCG/ACT inhaler Inhale 1-2 puffs into the lungs every 6 (six) hours as needed for wheezing or shortness of breath. 8 g 0  . ALTAVERA 0.15-30 MG-MCG tablet Take 1 tablet by mouth daily.    Marland Kitchen buPROPion (WELLBUTRIN SR) 150 MG 12 hr tablet Take 150 mg by mouth every morning.    . hydrOXYzine (ATARAX/VISTARIL) 10 MG tablet Take 10 mg by mouth 3 (three) times daily as needed.    . lidocaine (XYLOCAINE) 2 % solution Swallow 44ml to 10 ml as needed every 4 to 6 hours 100 mL 0  . omeprazole (PRILOSEC) 20 MG capsule Take 1 capsule (20 mg total) by mouth daily. (Patient taking differently: Take 20 mg by mouth as needed. Wean off over several weeks and then take as needed) 30 capsule 11  . ondansetron (ZOFRAN) 4 MG tablet 1 tablet under the tongue every 4 - 6 hours as needed for nausea 15 tablet 0  . spironolactone (ALDACTONE) 50 MG tablet Take 50 mg by mouth daily.    Marland Kitchen buPROPion (WELLBUTRIN XL) 150 MG 24 hr tablet Take 150 mg by mouth every morning.     No current facility-administered medications for this visit.    Allergies: Patient has no known allergies.  Past Medical History:  Diagnosis Date  . Anxiety   . Depression   . Fibroadenoma of both breasts   . GERD (gastroesophageal reflux disease)   . PCOS (polycystic ovarian syndrome)     Past Surgical History:  Procedure Laterality Date  . WISDOM TOOTH EXTRACTION    . WISDOM TOOTH EXTRACTION      Family History  Problem Relation Age of Onset  . Hypertension Mother   . Alcohol abuse Mother   . Hypertension Father   . Alcohol abuse Father   . Diabetes Maternal Grandmother   . Hypertension Maternal Grandfather   .  Hypertension Paternal Grandmother   . Leukemia Paternal Grandfather   . Diabetes Paternal Grandfather   . Hypertension Paternal Grandfather   . Polycystic ovary syndrome Sister   . Post-traumatic stress disorder Sister   . Bronchitis Sister   . Pneumonia Sister   . Colon cancer Neg Hx   . Esophageal cancer Neg Hx   . Rectal cancer Neg Hx   . Stomach cancer Neg Hx     Social History   Tobacco Use  . Smoking status: Passive Smoke Exposure - Never Smoker  . Smokeless tobacco: Never Used  Substance Use Topics  . Alcohol use: Yes    Comment: social    Subjective:  "Lump" in right axillary region x 2 weeks; feels like area has been increasing in size; no pain; denies any new soaps, foods, detergents or medications. No drainage, no breast pain, no rash;   LMP- 3 month cycling- notes that last period was approximately 1 1/2 months ago     Objective:  Vitals:   01/03/20 0855  BP: 102/64  Pulse: 80  Temp: 98.1 F (36.7 C)  TempSrc: Oral  SpO2: 99%  Weight: 155 lb 9.6 oz (70.6 kg)  Height: 5\' 5"  (1.651 m)    General: Well developed, well nourished, in no acute distress  Skin : Warm and dry.  Head: Normocephalic and atraumatic  Lungs: Respirations unlabored; Neurologic: Alert and oriented; speech intact; face symmetrical; moves all extremities well; CNII-XII intact without focal deficit  Right axillary exam- unremarkable; Left axillary exam- unremarkable  Assessment:  1. Axillary pain, right     Plan:  Physical exam is reassuring; due to history of fibroadenomas, will update ultrasound; patient also encouraged to apply warm compresses to area 3-4 x per day; follow-up to be determined.    No follow-ups on file.  Orders Placed This Encounter  Procedures  . US BREAST LTD UNI RIGHT INC AXILLA    INS: [facility] - cosign req 01/03/20 Annual - Pf: 02/04/18@[facility]  - no problems - no hx of br ca or sx - no needs np & pt     Standing Status:   Future    Standing Expiration Date:    03/04/2021    Order Specific Question:   Reason for Exam (SYMPTOM  OR DIAGNOSIS REQUIRED)    Answer:   lump in right axillary region    Order Specific Question:   Preferred imaging location?    Answer:   [facility]    Requested Prescriptions    No prescriptions requested or ordered in this encounter

## 2020-01-07 ENCOUNTER — Ambulatory Visit
Admission: RE | Admit: 2020-01-07 | Discharge: 2020-01-07 | Disposition: A | Discharge status: Home / Self Care | Payer: BLUE CROSS/BLUE SHIELD | Source: Ambulatory Visit | Attending: Family | Admitting: Family

## 2020-01-07 ENCOUNTER — Other Ambulatory Visit: Payer: Self-pay

## 2020-01-07 ENCOUNTER — Other Ambulatory Visit: Payer: Self-pay | Admitting: Family

## 2020-01-07 DIAGNOSIS — N6310 Unspecified lump in the right breast, unspecified quadrant: Secondary | ICD-10-CM | POA: Diagnosis not present

## 2020-01-07 DIAGNOSIS — M79621 Pain in right upper arm: Secondary | ICD-10-CM

## 2020-01-28 ENCOUNTER — Ambulatory Visit: Payer: BLUE CROSS/BLUE SHIELD

## 2020-01-29 ENCOUNTER — Ambulatory Visit: Payer: BLUE CROSS/BLUE SHIELD | Attending: Internal Medicine

## 2020-01-29 DIAGNOSIS — Z23 Encounter for immunization: Secondary | ICD-10-CM

## 2020-01-29 NOTE — Progress Notes (Signed)
   Covid-19 Vaccination Clinic  Name:  Darlene Wright    MRN: [id number] DOB: October 04, 1991  01/29/2020  [name] was observed post Covid-19 immunization for 15 minutes without incident. She was provided with Vaccine Information Sheet and instruction to access the V-Safe system.   [name] was instructed to call 911 with any severe reactions post vaccine: Marland Kitchen Difficulty breathing  . Swelling of face and throat  . A fast heartbeat  . A bad rash all over body  . Dizziness and weakness   Immunizations Administered    Name Date Dose VIS Date Route   Pfizer COVID-19 Vaccine 01/29/2020  9:13 AM 0.3 mL 10/01/2019 Intramuscular   Manufacturer: [place]   Lot: [id number]   [place]: [id number]

## 2020-02-21 ENCOUNTER — Ambulatory Visit: Payer: BLUE CROSS/BLUE SHIELD | Attending: Internal Medicine

## 2020-02-21 DIAGNOSIS — Z23 Encounter for immunization: Secondary | ICD-10-CM

## 2020-02-21 NOTE — Progress Notes (Signed)
   Covid-19 Vaccination Clinic  Name:  [name]    MRN: [id number] DOB: Nov 23, 1990  02/21/2020  [name] was observed post Covid-19 immunization for 15 minutes without incident. She was provided with Vaccine Information Sheet and instruction to access the V-Safe system.   [name] was instructed to call 911 with any severe reactions post vaccine: Marland Kitchen Difficulty breathing  . Swelling of face and throat  . A fast heartbeat  . A bad rash all over body  . Dizziness and weakness   Immunizations Administered    Name Date Dose VIS Date Route   Pfizer COVID-19 Vaccine 02/21/2020  3:56 PM 0.3 mL 12/15/2018 Intramuscular   Manufacturer: [place]   Lot: [id number]   [place]: [id number]

## 2020-03-17 DIAGNOSIS — F4310 Post-traumatic stress disorder, unspecified: Secondary | ICD-10-CM | POA: Diagnosis not present

## 2020-03-24 DIAGNOSIS — F4310 Post-traumatic stress disorder, unspecified: Secondary | ICD-10-CM | POA: Diagnosis not present

## 2020-03-27 DIAGNOSIS — N946 Dysmenorrhea, unspecified: Secondary | ICD-10-CM | POA: Diagnosis not present

## 2020-03-27 DIAGNOSIS — F419 Anxiety disorder, unspecified: Secondary | ICD-10-CM | POA: Diagnosis not present

## 2020-03-27 DIAGNOSIS — Z01419 Encounter for gynecological examination (general) (routine) without abnormal findings: Secondary | ICD-10-CM | POA: Diagnosis not present

## 2020-03-27 DIAGNOSIS — Z6826 Body mass index (BMI) 26.0-26.9, adult: Secondary | ICD-10-CM | POA: Diagnosis not present

## 2020-03-28 ENCOUNTER — Other Ambulatory Visit: Payer: Self-pay

## 2020-03-28 ENCOUNTER — Ambulatory Visit: Payer: BLUE CROSS/BLUE SHIELD | Admitting: Family

## 2020-03-28 ENCOUNTER — Encounter: Payer: Self-pay | Admitting: Family

## 2020-03-28 VITALS — BP 102/64 | HR 74 | Temp 98.2°F | Wt 155.2 lb

## 2020-03-28 DIAGNOSIS — R41840 Attention and concentration deficit: Secondary | ICD-10-CM | POA: Diagnosis not present

## 2020-03-28 NOTE — Patient Instructions (Signed)
Please schedule a follow-up with [facility] or see if you can get on the cancellation list with the psychiatrist to see if you can get in sooner.

## 2020-03-28 NOTE — Progress Notes (Signed)
Darlene Wright is a 29 y.o. female with the following history as recorded in EpicCare:  Patient Active Problem List   Diagnosis Date Noted   Polycystic ovarian syndrome 07/21/2018   BREAST MASS, LEFT 05/08/2009    Current Outpatient Medications  Medication Sig Dispense Refill   albuterol (VENTOLIN HFA) 108 (90 Base) MCG/ACT inhaler Inhale 1-2 puffs into the lungs every 6 (six) hours as needed for wheezing or shortness of breath. 8 g 0   ALTAVERA 0.15-30 MG-MCG tablet Take 1 tablet by mouth daily.     buPROPion (WELLBUTRIN SR) 150 MG 12 hr tablet Take 150 mg by mouth every morning.     hydrOXYzine (ATARAX/VISTARIL) 10 MG tablet Take 10 mg by mouth 3 (three) times daily as needed.     omeprazole (PRILOSEC) 20 MG capsule Take 1 capsule (20 mg total) by mouth daily. (Patient taking differently: Take 20 mg by mouth as needed. Wean off over several weeks and then take as needed) 30 capsule 11   ondansetron (ZOFRAN) 4 MG tablet 1 tablet under the tongue every 4 - 6 hours as needed for nausea 15 tablet 0   spironolactone (ALDACTONE) 50 MG tablet Take 50 mg by mouth daily.     levonorgestrel-ethinyl estradiol (SEASONALE) 0.15-0.03 MG tablet Take 1 tablet by mouth at bedtime.     No current facility-administered medications for this visit.    Allergies: Patient has no known allergies.  Past Medical History:  Diagnosis Date   Anxiety    Depression    Fibroadenoma of both breasts    GERD (gastroesophageal reflux disease)    PCOS (polycystic ovarian syndrome)     Past Surgical History:  Procedure Laterality Date   WISDOM TOOTH EXTRACTION     WISDOM TOOTH EXTRACTION      Family History  Problem Relation Age of Onset   Hypertension Mother    Alcohol abuse Mother    Hypertension Father    Alcohol abuse Father    Diabetes Maternal Grandmother    Hypertension Maternal Grandfather    Hypertension Paternal Grandmother    Leukemia Paternal Grandfather    Diabetes  Paternal Grandfather    Hypertension Paternal Grandfather    Polycystic ovary syndrome Sister    Post-traumatic stress disorder Sister    Bronchitis Sister    Pneumonia Sister    Colon cancer Neg Hx    Esophageal cancer Neg Hx    Rectal cancer Neg Hx    Stomach cancer Neg Hx     Social History   Tobacco Use   Smoking status: Passive Smoke Exposure - Never Smoker   Smokeless tobacco: Never Used  Substance Use Topics   Alcohol use: Yes    Comment: social    Subjective:  Patient is concerned that she has ADD; having trouble focusing- feels like her job is in jeopardy; was able to get through college and Master's degree with no difficulty however; does have known history of anxiety/ depression. Has appointment with psychiatrist at end of August but wanted to see what other options might be available for her; has reached out to [facility] but has to followed up with them. She discussed this concern with her GYN yesterday who is managing her anxiety/ PCOS but they noted they could not manage ADD for her.       Objective:  Vitals:   03/28/20 0858  BP: 102/64  Pulse: 74  Temp: 98.2 F (36.8 C)  TempSrc: Oral  SpO2: 97%  Weight:  155 lb 3.2 oz (70.4 kg)    General: Well developed, well nourished, in no acute distress  Skin : Warm and dry.  Lungs: Respirations unlabored;  Neurologic: Alert and oriented; speech intact; face symmetrical; moves all extremities well; CNII-XII intact without focal deficit   Assessment:  1. Attention deficit     Plan:  ? If she has ADD- according to patient, she was able to do well in college/ grad school with no difficulty; discussed that underling anxiety/ depression can contribute to an attention deficit as well; agree that she needs to see psychiatrist; she has already contacted [facility] and needs to follow-up with them to get an appointment.  This visit occurred during the SARS-CoV-2 public  health emergency.  Safety protocols were in place, including screening questions prior to the visit, additional usage of staff PPE, and extensive cleaning of exam room while observing appropriate contact time as indicated for disinfecting solutions.     No follow-ups on file.  No orders of the defined types were placed in this encounter.   Requested Prescriptions    No prescriptions requested or ordered in this encounter

## 2020-05-11 DIAGNOSIS — Z79899 Other long term (current) drug therapy: Secondary | ICD-10-CM | POA: Diagnosis not present

## 2020-05-11 DIAGNOSIS — F902 Attention-deficit hyperactivity disorder, combined type: Secondary | ICD-10-CM | POA: Diagnosis not present

## 2020-05-11 DIAGNOSIS — R41840 Attention and concentration deficit: Secondary | ICD-10-CM | POA: Diagnosis not present

## 2020-05-11 DIAGNOSIS — F419 Anxiety disorder, unspecified: Secondary | ICD-10-CM | POA: Diagnosis not present

## 2020-06-15 DIAGNOSIS — F4310 Post-traumatic stress disorder, unspecified: Secondary | ICD-10-CM | POA: Diagnosis not present

## 2020-07-04 DIAGNOSIS — F419 Anxiety disorder, unspecified: Secondary | ICD-10-CM | POA: Diagnosis not present

## 2020-07-04 DIAGNOSIS — F902 Attention-deficit hyperactivity disorder, combined type: Secondary | ICD-10-CM | POA: Diagnosis not present

## 2020-07-04 DIAGNOSIS — Z79899 Other long term (current) drug therapy: Secondary | ICD-10-CM | POA: Diagnosis not present

## 2020-07-06 DIAGNOSIS — F4310 Post-traumatic stress disorder, unspecified: Secondary | ICD-10-CM | POA: Diagnosis not present

## 2020-07-20 DIAGNOSIS — F4310 Post-traumatic stress disorder, unspecified: Secondary | ICD-10-CM | POA: Diagnosis not present

## 2020-08-03 DIAGNOSIS — F4310 Post-traumatic stress disorder, unspecified: Secondary | ICD-10-CM | POA: Diagnosis not present

## 2020-08-09 ENCOUNTER — Other Ambulatory Visit: Payer: Self-pay

## 2020-08-09 ENCOUNTER — Ambulatory Visit (INDEPENDENT_AMBULATORY_CARE_PROVIDER_SITE_OTHER): Payer: BLUE CROSS/BLUE SHIELD | Admitting: Family

## 2020-08-09 ENCOUNTER — Encounter: Payer: Self-pay | Admitting: Family

## 2020-08-09 VITALS — BP 110/70 | HR 64 | Temp 98.4°F | Ht 65.0 in | Wt 146.0 lb

## 2020-08-09 DIAGNOSIS — E282 Polycystic ovarian syndrome: Secondary | ICD-10-CM

## 2020-08-09 DIAGNOSIS — L709 Acne, unspecified: Secondary | ICD-10-CM | POA: Diagnosis not present

## 2020-08-09 DIAGNOSIS — M79673 Pain in unspecified foot: Secondary | ICD-10-CM

## 2020-08-09 DIAGNOSIS — Z0000 Encounter for general adult medical examination without abnormal findings: Secondary | ICD-10-CM

## 2020-08-09 DIAGNOSIS — Z13220 Encounter for screening for lipoid disorders: Secondary | ICD-10-CM | POA: Diagnosis not present

## 2020-08-09 LAB — LIPID PANEL
Cholesterol: 143 mg/dL (ref 0–200)
HDL: 58 mg/dL (ref >39.00)
LDL Cholesterol: 73 mg/dL (ref 0–99)
NonHDL: 85.22
Total CHOL/HDL Ratio: 2
Triglycerides: 63 mg/dL (ref 0.0–149.0)
VLDL: 12.6 mg/dL (ref 0.0–40.0)

## 2020-08-09 LAB — CBC WITH DIFFERENTIAL/PLATELET
Basophils Absolute: 0.1 10*3/uL (ref 0.0–0.1)
Basophils Relative: 1.3 % (ref 0.0–3.0)
Eosinophils Absolute: 0.2 10*3/uL (ref 0.0–0.7)
Eosinophils Relative: 2.9 % (ref 0.0–5.0)
HCT: 42.1 % (ref 36.0–46.0)
Hemoglobin: 14.5 g/dL (ref 12.0–15.0)
Lymphocytes Relative: 24.2 % (ref 12.0–46.0)
Lymphs Abs: 1.9 10*3/uL (ref 0.7–4.0)
MCHC: 34.5 g/dL (ref 30.0–36.0)
MCV: 86.6 fl (ref 78.0–100.0)
Monocytes Absolute: 0.6 10*3/uL (ref 0.1–1.0)
Monocytes Relative: 8 % (ref 3.0–12.0)
Neutro Abs: 5 10*3/uL (ref 1.4–7.7)
Neutrophils Relative %: 63.6 % (ref 43.0–77.0)
Platelets: 245 10*3/uL (ref 150.0–400.0)
RBC: 4.87 Mil/uL (ref 3.87–5.11)
RDW: 12.4 % (ref 11.5–15.5)
WBC: 7.9 10*3/uL (ref 4.0–10.5)

## 2020-08-09 LAB — COMPREHENSIVE METABOLIC PANEL
ALT: 8 U/L (ref 0–35)
AST: 13 U/L (ref 0–37)
Albumin: 3.9 g/dL (ref 3.5–5.2)
Alkaline Phosphatase: 75 U/L (ref 39–117)
BUN: 16 mg/dL (ref 6–23)
CO2: 27 mEq/L (ref 19–32)
Calcium: 9.2 mg/dL (ref 8.4–10.5)
Chloride: 102 mEq/L (ref 96–112)
Creatinine, Ser: 0.92 mg/dL (ref 0.40–1.20)
GFR: 85.2 mL/min (ref >60.00)
Glucose, Bld: 76 mg/dL (ref 70–99)
Potassium: 4 mEq/L (ref 3.5–5.1)
Sodium: 137 mEq/L (ref 135–145)
Total Bilirubin: 0.3 mg/dL (ref 0.2–1.2)
Total Protein: 7.3 g/dL (ref 6.0–8.3)

## 2020-08-09 LAB — TSH: TSH: 2.07 u[IU]/mL (ref 0.35–4.50)

## 2020-08-09 NOTE — Progress Notes (Signed)
Darlene Wright is a 29 y.o. female with the following history as recorded in [employer]:  Patient Active Problem List   Diagnosis Date Noted  . Polycystic ovarian syndrome 07/21/2018  . BREAST MASS, LEFT 05/08/2009    Current Outpatient Medications  Medication Sig Dispense Refill  . Biotin 1000 MCG tablet     . albuterol (VENTOLIN HFA) 108 (90 Base) MCG/ACT inhaler Inhale 1-2 puffs into the lungs every 6 (six) hours as needed for wheezing or shortness of breath. 8 g 0  . ALTAVERA 0.15-30 MG-MCG tablet Take 1 tablet by mouth daily.    Marland Kitchen buPROPion (WELLBUTRIN SR) 150 MG 12 hr tablet Take 150 mg by mouth every morning.    . hydrOXYzine (ATARAX/VISTARIL) 10 MG tablet Take 10 mg by mouth 3 (three) times daily as needed.    Marland Kitchen levonorgestrel-ethinyl estradiol (SEASONALE) 0.15-0.03 MG tablet Take 1 tablet by mouth at bedtime.    Marland Kitchen omeprazole (PRILOSEC) 20 MG capsule Take 1 capsule (20 mg total) by mouth daily. (Patient taking differently: Take 20 mg by mouth as needed. Wean off over several weeks and then take as needed) 30 capsule 11  . ondansetron (ZOFRAN) 4 MG tablet 1 tablet under the tongue every 4 - 6 hours as needed for nausea 15 tablet 0  . spironolactone (ALDACTONE) 50 MG tablet Take 50 mg by mouth daily.    Marland Kitchen VYVANSE 20 MG capsule Take 20 mg by mouth at bedtime.    Marland Kitchen VYVANSE 30 MG CHEW Chew 1 tablet by mouth every morning.     No current facility-administered medications for this visit.    Allergies: Patient has no known allergies.  Past Medical History:  Diagnosis Date  . Anxiety   . Depression   . Fibroadenoma of both breasts   . GERD (gastroesophageal reflux disease)   . PCOS (polycystic ovarian syndrome)     Past Surgical History:  Procedure Laterality Date  . WISDOM TOOTH EXTRACTION    . WISDOM TOOTH EXTRACTION      Family History  Problem Relation Age of Onset  . Hypertension Mother   . Alcohol abuse Mother   . Hypertension Father   . Alcohol abuse Father   .  Diabetes Maternal Grandmother   . Hypertension Maternal Grandfather   . Hypertension Paternal Grandmother   . Leukemia Paternal Grandfather   . Diabetes Paternal Grandfather   . Hypertension Paternal Grandfather   . Polycystic ovary syndrome Sister   . Post-traumatic stress disorder Sister   . Bronchitis Sister   . Pneumonia Sister   . Colon cancer Neg Hx   . Esophageal cancer Neg Hx   . Rectal cancer Neg Hx   . Stomach cancer Neg Hx     Social History   Tobacco Use  . Smoking status: Passive Smoke Exposure - Never Smoker  . Smokeless tobacco: Never Used  Substance Use Topics  . Alcohol use: Yes    Comment: social    Subjective:  Presents for yearly CPE; needs biometric screen completed for employer; does see GYN and psychiatrist and GI; in baseline state of health today;  Actively working on weight loss- exercising regularly;   Review of Systems  Constitutional: Negative.   HENT: Negative.   Eyes: Negative.   Respiratory: Negative.   Cardiovascular: Negative.   Gastrointestinal: Negative.   Genitourinary: Negative.   Musculoskeletal: Negative.   Skin: Negative.   Neurological: Negative.   Endo/Heme/Allergies: Negative.   Psychiatric/Behavioral: Negative.  Objective:  Vitals:   08/09/20 0848  BP: 110/70  Pulse: 64  Temp: 98.4 F (36.9 C)  TempSrc: Oral  SpO2: 98%  Weight: 146 lb (66.2 kg)  Height: _0  (1.651 m)    General: Well developed, well nourished, in no acute distress  Skin : Warm and dry.  Head: Normocephalic and atraumatic  Eyes: Sclera and conjunctiva clear; pupils round and reactive to light; extraocular movements intact  Ears: External normal; canals clear; tympanic membranes normal  Oropharynx: Pink, supple. No suspicious lesions  Neck: Supple without thyromegaly, adenopathy  Lungs: Respirations unlabored; clear to auscultation bilaterally without wheeze, rales, rhonchi  CVS exam: normal rate and regular rhythm.  Abdomen: Soft;  nontender; nondistended; normoactive bowel sounds; no masses or hepatosplenomegaly  Musculoskeletal: No deformities; no active joint inflammation  Extremities: No edema, cyanosis, clubbing  Vessels: Symmetric bilaterally  Neurologic: Alert and oriented; speech intact; face symmetrical; moves all extremities well; CNII-XII intact without focal deficit   Assessment:  1. PE (physical exam), annual   2. Lipid screening     Plan:   Age appropriate preventive healthcare needs addressed; encouraged regular eye doctor and dental exams; encouraged regular exercise; will update labs and refills as needed today; follow-up to be determined;     No follow-ups on file.  Orders Placed This Encounter  Procedures  . CBC with Differential/Platelet    Standing Status:   Future    Standing Expiration Date:   08/09/2021  . Comp Met (CMET)    Standing Status:   Future    Standing Expiration Date:   08/09/2021  . Lipid panel    Standing Status:   Future    Standing Expiration Date:   08/09/2021  . TSH    Standing Status:   Future    Standing Expiration Date:   08/09/2021    Requested Prescriptions    No prescriptions requested or ordered in this encounter

## 2020-08-10 DIAGNOSIS — F4310 Post-traumatic stress disorder, unspecified: Secondary | ICD-10-CM | POA: Diagnosis not present

## 2020-08-12 DIAGNOSIS — F422 Mixed obsessional thoughts and acts: Secondary | ICD-10-CM | POA: Diagnosis not present

## 2020-08-12 DIAGNOSIS — F900 Attention-deficit hyperactivity disorder, predominantly inattentive type: Secondary | ICD-10-CM | POA: Diagnosis not present

## 2020-08-23 ENCOUNTER — Other Ambulatory Visit: Payer: Self-pay

## 2020-08-23 ENCOUNTER — Ambulatory Visit: Payer: BLUE CROSS/BLUE SHIELD | Admitting: Podiatry

## 2020-08-23 ENCOUNTER — Ambulatory Visit (INDEPENDENT_AMBULATORY_CARE_PROVIDER_SITE_OTHER): Payer: BLUE CROSS/BLUE SHIELD

## 2020-08-23 DIAGNOSIS — M79673 Pain in unspecified foot: Secondary | ICD-10-CM | POA: Diagnosis not present

## 2020-08-23 DIAGNOSIS — M7750 Other enthesopathy of unspecified foot: Secondary | ICD-10-CM

## 2020-08-23 DIAGNOSIS — M7751 Other enthesopathy of right foot: Secondary | ICD-10-CM | POA: Diagnosis not present

## 2020-08-23 DIAGNOSIS — Q667 Congenital pes cavus, unspecified foot: Secondary | ICD-10-CM

## 2020-08-23 DIAGNOSIS — M7752 Other enthesopathy of left foot: Secondary | ICD-10-CM

## 2020-08-24 DIAGNOSIS — F4310 Post-traumatic stress disorder, unspecified: Secondary | ICD-10-CM | POA: Diagnosis not present

## 2020-08-25 ENCOUNTER — Encounter: Payer: Self-pay | Admitting: Podiatry

## 2020-08-25 NOTE — Progress Notes (Signed)
Subjective:  Patient ID: Darlene Wright, female    DOB: 1991-01-24,  MRN: [id number]  Chief Complaint  Patient presents with  . Foot Pain    bilateral    29 y.o. female presents with the above complaint.  Patient presents with complaint of bilateral foot pain that is very mild in nature.  Patient states the right side is little bit more than the left side.  Patient states been going on for many years on and off.  There are hurt at all at this time.  Patient states that she is got a high arch foot.  She does not wear orthotics.  She would like one I discussed all the treatment options that are available to her.  She denies seeing anyone else prior to seeing me for the specific reason.  She does not wear any orthotics.   Review of Systems: Negative except as noted in the HPI. Denies N/V/F/Ch.  Past Medical History:  Diagnosis Date  . Anxiety   . Depression   . Fibroadenoma of both breasts   . GERD (gastroesophageal reflux disease)   . PCOS (polycystic ovarian syndrome)     Current Outpatient Medications:  .  albuterol (VENTOLIN HFA) 108 (90 Base) MCG/ACT inhaler, Inhale 1-2 puffs into the lungs every 6 (six) hours as needed for wheezing or shortness of breath., Disp: 8 g, Rfl: 0 .  Biotin 1000 MCG tablet, , Disp: , Rfl:  .  buPROPion (WELLBUTRIN SR) 150 MG 12 hr tablet, Take 150 mg by mouth every morning., Disp: , Rfl:  .  buPROPion (WELLBUTRIN XL) 150 MG 24 hr tablet, Take 150 mg by mouth at bedtime., Disp: , Rfl:  .  hydrOXYzine (ATARAX/VISTARIL) 10 MG tablet, Take 10 mg by mouth 3 (three) times daily as needed., Disp: , Rfl:  .  levonorgestrel-ethinyl estradiol (SEASONALE) 0.15-0.03 MG tablet, Take 1 tablet by mouth at bedtime., Disp: , Rfl:  .  omeprazole (PRILOSEC) 20 MG capsule, Take 1 capsule (20 mg total) by mouth daily. (Patient taking differently: Take 20 mg by mouth as needed. Wean off over several weeks and then take as needed), Disp: 30 capsule, Rfl: 11 .  spironolactone  (ALDACTONE) 50 MG tablet, Take 50 mg by mouth daily., Disp: , Rfl:  .  VYVANSE 20 MG capsule, Take 20 mg by mouth at bedtime., Disp: , Rfl:  .  VYVANSE 30 MG CHEW, Chew 1 tablet by mouth every morning., Disp: , Rfl:   Social History   Tobacco Use  Smoking Status Passive Smoke Exposure - Never Smoker  Smokeless Tobacco Never Used    No Known Allergies Objective:  There were no vitals filed for this visit. There is no height or weight on file to calculate BMI. Constitutional Well developed. Well nourished.  Vascular Dorsalis pedis pulses palpable bilaterally. Posterior tibial pulses palpable bilaterally. Capillary refill normal to all digits.  No cyanosis or clubbing noted. Pedal hair growth normal.  Neurologic Normal speech. Oriented to person, place, and time. Epicritic sensation to light touch grossly present bilaterally.  Dermatologic Nails well groomed and normal in appearance. No open wounds. No skin lesions.  Orthopedic:  Gait examination shows pes cavus foot structure with high arch foot type plantar fascia.  No pain in both lower extremity.  Mild discomfort in the arch of the foot.  These findings are consistent bilaterally.   Radiographs: 3 views of skeletally mature adult bilateral foot: There is increasing calcaneal inclination angle decrease in talar declination angle.  Apex  of the deformity/pes cavus is at the midfoot.  Findings are consistent for bilateral foot.  No other bony abnormalities noted.  No fractures noted. Assessment:   1. Pes cavus   2. Arch pain, unspecified laterality    Plan:  Patient was evaluated and treated and all questions answered.  Bilateral pes cavus foot structure -I explained to the patient the etiology of pes cavus foot structure and various treatment options were extensively discussed.  I believe patient will benefit from orthotics.  I discussed with the patient the advantage of custom orthotics versus over-the-counter orthotics.  For  now given the patient's pain is very mild/nonexistent in nature I believe she will benefit from custom-made orthotics for generalized support.  If she continues to have pain or if her over-the-counter orthotics do not function well then I will discuss custom orthotics with her. -She was educated on power steps orthotics.  She states she will obtain them.  No follow-ups on file.

## 2020-09-07 DIAGNOSIS — F4310 Post-traumatic stress disorder, unspecified: Secondary | ICD-10-CM | POA: Diagnosis not present

## 2020-09-21 DIAGNOSIS — F4310 Post-traumatic stress disorder, unspecified: Secondary | ICD-10-CM | POA: Diagnosis not present

## 2020-10-05 DIAGNOSIS — F4310 Post-traumatic stress disorder, unspecified: Secondary | ICD-10-CM | POA: Diagnosis not present

## 2020-10-19 DIAGNOSIS — F4310 Post-traumatic stress disorder, unspecified: Secondary | ICD-10-CM | POA: Diagnosis not present

## 2020-11-02 DIAGNOSIS — F4310 Post-traumatic stress disorder, unspecified: Secondary | ICD-10-CM | POA: Diagnosis not present

## 2020-11-16 DIAGNOSIS — F4310 Post-traumatic stress disorder, unspecified: Secondary | ICD-10-CM | POA: Diagnosis not present

## 2020-11-21 DIAGNOSIS — F902 Attention-deficit hyperactivity disorder, combined type: Secondary | ICD-10-CM | POA: Diagnosis not present

## 2020-11-21 DIAGNOSIS — Z79899 Other long term (current) drug therapy: Secondary | ICD-10-CM | POA: Diagnosis not present

## 2020-11-30 DIAGNOSIS — F4310 Post-traumatic stress disorder, unspecified: Secondary | ICD-10-CM | POA: Diagnosis not present

## 2021-02-08 DIAGNOSIS — D235 Other benign neoplasm of skin of trunk: Secondary | ICD-10-CM | POA: Diagnosis not present

## 2021-02-08 DIAGNOSIS — L650 Telogen effluvium: Secondary | ICD-10-CM | POA: Diagnosis not present

## 2021-02-08 DIAGNOSIS — L700 Acne vulgaris: Secondary | ICD-10-CM | POA: Diagnosis not present

## 2021-02-19 DIAGNOSIS — F902 Attention-deficit hyperactivity disorder, combined type: Secondary | ICD-10-CM | POA: Diagnosis not present

## 2021-02-19 DIAGNOSIS — Z79899 Other long term (current) drug therapy: Secondary | ICD-10-CM | POA: Diagnosis not present

## 2021-03-13 ENCOUNTER — Other Ambulatory Visit: Payer: Self-pay

## 2021-03-13 ENCOUNTER — Encounter: Payer: Self-pay | Admitting: Family

## 2021-03-13 ENCOUNTER — Ambulatory Visit: Payer: BLUE CROSS/BLUE SHIELD | Admitting: Family

## 2021-03-13 VITALS — BP 118/68 | HR 75 | Temp 98.1°F | Ht 65.0 in | Wt 149.0 lb

## 2021-03-13 DIAGNOSIS — R002 Palpitations: Secondary | ICD-10-CM

## 2021-03-13 DIAGNOSIS — R000 Tachycardia, unspecified: Secondary | ICD-10-CM | POA: Diagnosis not present

## 2021-03-13 LAB — COMPREHENSIVE METABOLIC PANEL
ALT: 9 U/L (ref 0–35)
AST: 12 U/L (ref 0–37)
Albumin: 4 g/dL (ref 3.5–5.2)
Alkaline Phosphatase: 78 U/L (ref 39–117)
BUN: 14 mg/dL (ref 6–23)
CO2: 27 mEq/L (ref 19–32)
Calcium: 9.3 mg/dL (ref 8.4–10.5)
Chloride: 103 mEq/L (ref 96–112)
Creatinine, Ser: 0.7 mg/dL (ref 0.40–1.20)
GFR: 118.2 mL/min (ref >60.00)
Glucose, Bld: 78 mg/dL (ref 70–99)
Potassium: 3.9 mEq/L (ref 3.5–5.1)
Sodium: 138 mEq/L (ref 135–145)
Total Bilirubin: 0.5 mg/dL (ref 0.2–1.2)
Total Protein: 7.2 g/dL (ref 6.0–8.3)

## 2021-03-13 LAB — CBC WITH DIFFERENTIAL/PLATELET
Basophils Absolute: 0.1 10*3/uL (ref 0.0–0.1)
Basophils Relative: 0.9 % (ref 0.0–3.0)
Eosinophils Absolute: 0.2 10*3/uL (ref 0.0–0.7)
Eosinophils Relative: 3.3 % (ref 0.0–5.0)
HCT: 40.4 % (ref 36.0–46.0)
Hemoglobin: 13.8 g/dL (ref 12.0–15.0)
Lymphocytes Relative: 35.7 % (ref 12.0–46.0)
Lymphs Abs: 2.6 10*3/uL (ref 0.7–4.0)
MCHC: 34.1 g/dL (ref 30.0–36.0)
MCV: 88.1 fl (ref 78.0–100.0)
Monocytes Absolute: 0.7 10*3/uL (ref 0.1–1.0)
Monocytes Relative: 9.1 % (ref 3.0–12.0)
Neutro Abs: 3.7 10*3/uL (ref 1.4–7.7)
Neutrophils Relative %: 51 % (ref 43.0–77.0)
Platelets: 257 10*3/uL (ref 150.0–400.0)
RBC: 4.58 Mil/uL (ref 3.87–5.11)
RDW: 12.5 % (ref 11.5–15.5)
WBC: 7.3 10*3/uL (ref 4.0–10.5)

## 2021-03-13 LAB — TSH: TSH: 1.52 u[IU]/mL (ref 0.35–4.50)

## 2021-03-13 NOTE — Progress Notes (Signed)
Darlene Wright is a 30 y.o. female with the following history as recorded in [employer]:  Patient Active Problem List   Diagnosis Date Noted  . Polycystic ovarian syndrome 07/21/2018  . BREAST MASS, LEFT 05/08/2009    Current Outpatient Medications  Medication Sig Dispense Refill  . albuterol (VENTOLIN HFA) 108 (90 Base) MCG/ACT inhaler Inhale 1-2 puffs into the lungs every 6 (six) hours as needed for wheezing or shortness of breath. 8 g 0  . amphetamine-dextroamphetamine (ADDERALL XR) 20 MG 24 hr capsule Take 20 mg by mouth daily.    . Biotin 1000 MCG tablet     . buPROPion (WELLBUTRIN XL) 150 MG 24 hr tablet Take 150 mg by mouth at bedtime.    . hydrOXYzine (ATARAX/VISTARIL) 10 MG tablet Take 10 mg by mouth 3 (three) times daily as needed.    Marland Kitchen levonorgestrel-ethinyl estradiol (SEASONALE) 0.15-0.03 MG tablet Take 1 tablet by mouth at bedtime.    Marland Kitchen spironolactone (ALDACTONE) 50 MG tablet Take 50 mg by mouth daily.    Marland Kitchen VYVANSE 30 MG CHEW Chew 1 tablet by mouth every morning.    Marland Kitchen buPROPion (WELLBUTRIN SR) 150 MG 12 hr tablet Take 150 mg by mouth every morning. (Patient not taking: Reported on 03/13/2021)    . omeprazole (PRILOSEC) 20 MG capsule Take 1 capsule (20 mg total) by mouth daily. (Patient not taking: Reported on 03/13/2021) 30 capsule 11  . VYVANSE 20 MG capsule Take 20 mg by mouth at bedtime. (Patient not taking: Reported on 03/13/2021)     No current facility-administered medications for this visit.    Allergies: Patient has no known allergies.  Past Medical History:  Diagnosis Date  . Anxiety   . Depression   . Fibroadenoma of both breasts   . GERD (gastroesophageal reflux disease)   . PCOS (polycystic ovarian syndrome)     Past Surgical History:  Procedure Laterality Date  . WISDOM TOOTH EXTRACTION    . WISDOM TOOTH EXTRACTION      Family History  Problem Relation Age of Onset  . Hypertension Mother   . Alcohol abuse Mother   . Hypertension Father   . Alcohol  abuse Father   . Diabetes Maternal Grandmother   . Hypertension Maternal Grandfather   . Hypertension Paternal Grandmother   . Leukemia Paternal Grandfather   . Diabetes Paternal Grandfather   . Hypertension Paternal Grandfather   . Polycystic ovary syndrome Sister   . Post-traumatic stress disorder Sister   . Bronchitis Sister   . Pneumonia Sister   . Colon cancer Neg Hx   . Esophageal cancer Neg Hx   . Rectal cancer Neg Hx   . Stomach cancer Neg Hx     Social History   Tobacco Use  . Smoking status: Passive Smoke Exposure - Never Smoker  . Smokeless tobacco: Never Used  Substance Use Topics  . Alcohol use: Yes    Comment: social    Subjective:  Patient presents with concerns for increased palpitations/ tachycardia in the past few months; does take Vyvanse and Adderall in combination but notes that symptoms were present prior to changing her ADD regimen; has spoken to psychiatrist and did not feel that medications  was source; Per patient, symptoms are not daily; may occur weekly; notes that heart rate can speed up from 70- 135/ 146 and last for about 1-2 minute; has not been able to determine any triggers;  Not exercising regularly; does have some intermittent chest pain but not on  exertion and no shortness of breath; did have some symptoms earlier this year where she felt light headed and felt like her blood sugar was dropping;      Objective:  Vitals:   03/13/21 1126  BP: 118/68  Pulse: 75  Temp: 98.1 F (36.7 C)  TempSrc: Oral  SpO2: 99%  Weight: 149 lb (67.6 kg)  Height: _0  (1.651 m)    General: Well developed, well nourished, in no acute distress  Skin : Warm and dry.  Head: Normocephalic and atraumatic  Eyes: Sclera and conjunctiva clear; pupils round and reactive to light; extraocular movements intact  Ears: External normal; canals clear; tympanic membranes normal  Oropharynx: Pink, supple. No suspicious lesions  Neck: Supple without thyromegaly,  adenopathy  Lungs: Respirations unlabored; clear to auscultation bilaterally without wheeze, rales, rhonchi  CVS exam: normal rate and regular rhythm.  Neurologic: Alert and oriented; speech intact; face symmetrical; moves all extremities well; CNII-XII intact without focal deficit   Assessment:  1. Heart palpitations   2. Tachycardia     Plan:  EKG in office shows NSR; check labs today; refer to cardiology for further evaluation; To consider neurology consult as well for possible autonomic dysfunction if no cardiac source noted;  This visit occurred during the SARS-CoV-2 public health emergency.  Safety protocols were in place, including screening questions prior to the visit, additional usage of staff PPE, and extensive cleaning of exam room while observing appropriate contact time as indicated for disinfecting solutions.     No follow-ups on file.  Orders Placed This Encounter  Procedures  . CBC with Differential/Platelet  . Comp Met (CMET)  . TSH  . Ambulatory referral to Cardiology    Referral Priority:   Routine    Referral Type:   Consultation    Referral Reason:   Specialty Services Required    Requested Specialty:   Cardiology    Number of Visits Requested:   1  . EKG 12-Lead    Requested Prescriptions    No prescriptions requested or ordered in this encounter

## 2021-04-10 DIAGNOSIS — Z6823 Body mass index (BMI) 23.0-23.9, adult: Secondary | ICD-10-CM | POA: Diagnosis not present

## 2021-04-10 DIAGNOSIS — E282 Polycystic ovarian syndrome: Secondary | ICD-10-CM | POA: Diagnosis not present

## 2021-04-10 DIAGNOSIS — Z01419 Encounter for gynecological examination (general) (routine) without abnormal findings: Secondary | ICD-10-CM | POA: Diagnosis not present

## 2021-04-19 DIAGNOSIS — L700 Acne vulgaris: Secondary | ICD-10-CM | POA: Diagnosis not present

## 2021-04-19 DIAGNOSIS — D171 Benign lipomatous neoplasm of skin and subcutaneous tissue of trunk: Secondary | ICD-10-CM | POA: Diagnosis not present

## 2021-05-15 DIAGNOSIS — Z79899 Other long term (current) drug therapy: Secondary | ICD-10-CM | POA: Diagnosis not present

## 2021-05-15 DIAGNOSIS — F902 Attention-deficit hyperactivity disorder, combined type: Secondary | ICD-10-CM | POA: Diagnosis not present

## 2021-05-17 ENCOUNTER — Encounter: Payer: Self-pay | Admitting: Interventional Cardiology

## 2021-05-17 ENCOUNTER — Other Ambulatory Visit: Payer: Self-pay

## 2021-05-17 ENCOUNTER — Ambulatory Visit: Payer: BLUE CROSS/BLUE SHIELD | Admitting: Interventional Cardiology

## 2021-05-17 ENCOUNTER — Ambulatory Visit (INDEPENDENT_AMBULATORY_CARE_PROVIDER_SITE_OTHER): Payer: BLUE CROSS/BLUE SHIELD

## 2021-05-17 VITALS — BP 98/60 | HR 75 | Ht 65.0 in | Wt 143.2 lb

## 2021-05-17 DIAGNOSIS — R002 Palpitations: Secondary | ICD-10-CM

## 2021-05-17 DIAGNOSIS — R0789 Other chest pain: Secondary | ICD-10-CM

## 2021-05-17 NOTE — Progress Notes (Signed)
Cardiology Office Note   Date:  05/17/2021   ID:  Darlene Wright, DOB [birth year], MRN [id number]  PCP:  Darlene Salvage, FNP    No chief complaint on file.  palpitations  Wt Readings from Last 3 Encounters:  05/17/21 143 lb 3.2 oz (65 kg)  03/13/21 149 lb (67.6 kg)  08/09/20 146 lb (66.2 kg)       History of Present Illness: Darlene Wright is a 30 y.o. female who is being seen today for the evaluation of palpitations at the request of Darlene Wright,*.   Prior records show that she saw Dr. Valere Dross in May 2022 with the following history:   "Patient presents with concerns for increased palpitations/ tachycardia in the past few months; does take Vyvanse and Adderall in combination but notes that symptoms were present prior to changing her ADD regimen; has spoken to psychiatrist and did not feel that medications  was source; Per patient, symptoms are not daily; may occur weekly; notes that heart rate can speed up from 70- 135/ 146 and last for about 1-2 minute; has not been able to determine any triggers; Not exercising regularly; does have some intermittent chest pain but not on exertion and no shortness of breath; did have some symptoms earlier this year where she felt light headed and felt like her blood sugar was dropping"  Feels like a hot flash.  Sx seem to resolve after 1 minute.  She was wearing a smart watch.  HR can jump from 75 to 135 suddenly based on her watch.  No ECG tracings available.    Other spikes include 74 to 137, HR can stay above 100 bpm for a while. Feels different than her prior panic attacks. She has kept good records and refers to her phone for her notes.   No triggers identified.  No change with change in position. No chest pain or dizziness with palpitations.   BP has been stable.    Denies : Leg edema. Nitroglycerin use. Orthopnea. Paroxysmal nocturnal dyspnea. Shortness of breath. Syncope.    Dizziness with low blood sugar.   Attributes this to PCOS.   Has had some random chest pains lasting a few seconds at a time, not related to exertion.   No early CAD in her family.   Past Medical History:  Diagnosis Date   Anxiety    Depression    Fibroadenoma of both breasts    GERD (gastroesophageal reflux disease)    Palpitations    PCOS (polycystic ovarian syndrome)     Past Surgical History:  Procedure Laterality Date   WISDOM TOOTH EXTRACTION     WISDOM TOOTH EXTRACTION       Current Outpatient Medications  Medication Sig Dispense Refill   albuterol (VENTOLIN HFA) 108 (90 Base) MCG/ACT inhaler Inhale 1-2 puffs into the lungs every 6 (six) hours as needed for wheezing or shortness of breath. 8 g 0   amphetamine-dextroamphetamine (ADDERALL XR) 20 MG 24 hr capsule Take 20 mg by mouth daily.     Biotin 1000 MCG tablet      buPROPion (WELLBUTRIN SR) 150 MG 12 hr tablet Take 150 mg by mouth every morning.     buPROPion (WELLBUTRIN XL) 150 MG 24 hr tablet Take 150 mg by mouth at bedtime.     hydrOXYzine (ATARAX/VISTARIL) 10 MG tablet Take 10 mg by mouth 3 (three) times daily as needed.     levonorgestrel-ethinyl estradiol (SEASONALE) 0.15-0.03 MG tablet Take 1 tablet  by mouth at bedtime.     omeprazole (PRILOSEC) 20 MG capsule Take 1 capsule (20 mg total) by mouth daily. 30 capsule 11   spironolactone (ALDACTONE) 25 MG tablet Take 25 mg by mouth daily.     VYVANSE 20 MG capsule Take 20 mg by mouth at bedtime.     VYVANSE 30 MG CHEW Chew 1 tablet by mouth every morning.     Dapsone 7.5 % GEL SMARTSIG:Sparingly Topical Every Morning     tretinoin (RETIN-A) 0.025 % cream Apply topically.     No current facility-administered medications for this visit.    Allergies:   Patient has no known allergies.    Social History:  The patient  reports that she is a non-smoker but has been exposed to tobacco smoke. She has never used smokeless tobacco. She reports current alcohol use. She reports that she does not use  drugs.   Family History:  The patient's family history includes Alcohol abuse in her father and mother; Bronchitis in her sister; Diabetes in her maternal grandmother and paternal grandfather; Hypertension in her father, maternal grandfather, mother, paternal grandfather, and paternal grandmother; Leukemia in her paternal grandfather; Pneumonia in her sister; Polycystic ovary syndrome in her sister; Post-traumatic stress disorder in her sister.    ROS:  Please see the history of present illness.   Otherwise, review of systems are positive for palpitations.   All other systems are reviewed and negative.    PHYSICAL EXAM: VS:  BP 98/60   Pulse 75   Ht '5\' 5"'$  (1.651 m)   Wt 143 lb 3.2 oz (65 kg)   SpO2 97%   BMI 23.83 kg/m  , BMI Body mass index is 23.83 kg/m. GEN: Well nourished, well developed, in no acute distress HEENT: normal Neck: no JVD, carotid bruits, or masses Cardiac: RRR; no murmurs, rubs, or gallops,no edema ; no pain to palpation of her sternum; no clicking in her sternum with twisting movements Respiratory:  clear to auscultation bilaterally, normal work of breathing GI: soft, nontender, nondistended, + BS MS: no deformity or atrophy Skin: warm and dry, no rash Neuro:  Strength and sensation are intact Psych: euthymic mood, full affect; mildly anxious   EKG:   The ekg ordered in May 2022 demonstrates normal sinus rhythm, inverted T waves noted in V1 and V2, no ST segment changes; Normal ECG today   Recent Labs: 03/13/2021: ALT 9; BUN 14; Creatinine, Ser 0.70; Hemoglobin 13.8; Platelets 257.0; Potassium 3.9; Sodium 138; TSH 1.52   Lipid Panel    Component Value Date/Time   CHOL 143 08/09/2020 0913   TRIG 63.0 08/09/2020 0913   HDL 58.00 08/09/2020 0913   CHOLHDL 2 08/09/2020 0913   VLDL 12.6 08/09/2020 0913   LDLCALC 73 08/09/2020 0913     Other studies Reviewed: Additional studies/ records that were reviewed today with results demonstrating: prior ECG  reviewed   ASSESSMENT AND PLAN:  Palpitations: Symptoms do not sound life-threatening or like a serious arrhythmia given that there is no syncope.  Normal cardiac exam as well.  Plan for 2 weeks Zio patch.  She does have a smart watch but cannot get ECG tracings from this device. Atypical chest pain: Not related to exertion.  No ischemic w/u planned. We went over healthy lifestyle habits as noted below.  She does note that her tolerance of plant-based foods is limited and there are only a few vegetables that she likes. She takes several stimulants related to her attention issues  but was told that this is not causing her increased heart rate.   Current medicines are reviewed at length with the patient today.  The patient concerns regarding her medicines were addressed.  The following changes have been made:  No change  Labs/ tests ordered today include: Zio patch No orders of the defined types were placed in this encounter.   Recommend 150 minutes/week of aerobic exercise Low fat, low carb, high fiber diet recommended  Disposition:   FU based on monitor results.    Signed, Larae Grooms, MD  05/17/2021 9:34 AM    [facility] [address], [place], [place]  [address] Phone: [phone]; Fax: [phone]

## 2021-05-17 NOTE — Patient Instructions (Signed)
Medication Instructions:  Your physician recommends that you continue on your current medications as directed. Please refer to the Current Medication list given to you today.  *If you need a refill on your cardiac medications before your next appointment, please call your pharmacy*   Lab Work: none If you have labs (blood work) drawn today and your tests are completely normal, you will receive your results only by: [place] (if you have MyChart) OR A paper copy in the mail If you have any lab test that is abnormal or we need to change your treatment, we will call you to review the results.   Testing/Procedures: Your physician has recommended that you wear a holter monitor. Holter monitors are medical devices that record the heart's electrical activity. Doctors most often use these monitors to diagnose arrhythmias. Arrhythmias are problems with the speed or rhythm of the heartbeat. The monitor is a small, portable device. You can wear one while you do your normal daily activities. This is usually used to diagnose what is causing palpitations/syncope (passing out). 2 week zio   Follow-Up: At [facility], you and your health needs are our priority.  As part of our continuing mission to provide you with exceptional heart care, we have created designated Provider Care Teams.  These Care Teams include your primary Cardiologist (physician) and Advanced Practice Providers (APPs -  Physician Assistants and Nurse Practitioners) who all work together to provide you with the care you need, when you need it.  We recommend signing up for the patient portal called "MyChart".  Sign up information is provided on this After Visit Summary.  MyChart is used to connect with patients for Virtual Visits (Telemedicine).  Patients are able to view lab/test results, encounter notes, upcoming appointments, etc.  Non-urgent messages can be sent to your provider as well.   To learn more about what you can do  with MyChart, go to [email or web address].    Your next appointment:   As needed  The format for your next appointment:   In Person  Provider:   You may see Larae Grooms, MD or one of the following Advanced Practice Providers on your designated Care Team:   Melina Copa, PA-C Ermalinda Barrios, PA-C   Other Instructions Bryn Gulling- Long Term Monitor Instructions  Your physician has requested you wear a ZIO patch monitor for 14 days.  This is a single patch monitor. [employer] supplies one patch monitor per enrollment. Additional stickers are not available. Please do not apply patch if you will be having a Nuclear Stress Test,  Echocardiogram, Cardiac CT, MRI, or Chest Xray during the period you would be wearing the  monitor. The patch cannot be worn during these tests. You cannot remove and re-apply the  ZIO XT patch monitor.  Your ZIO patch monitor will be mailed 3 day USPS to your address on file. It may take 3-5 days  to receive your monitor after you have been enrolled.  Once you have received your monitor, please review the enclosed instructions. Your monitor  has already been registered assigning a specific monitor serial # to you.  Billing and Patient Assistance Program Information  We have supplied [employer] with any of your insurance information on file for billing purposes. [employer] offers a sliding scale Patient Assistance Program for patients that do not have  insurance, or whose insurance does not completely cover the cost of the ZIO monitor.  You must apply for the Patient Assistance Program to qualify for this discounted  rate.  To apply, please call [employer] at [phone], select option 4, select option 2, ask to apply for  Patient Assistance Program. Theodore Demark will ask your household income, and how many people  are in your household. They will quote your out-of-pocket cost based on that information.  [employer] will also be able to set up a 28-month interest-free payment  plan if needed.  Applying the monitor   Shave hair from upper left chest.  Hold abrader disc by orange tab. Rub abrader in 40 strokes over the upper left chest as  indicated in your monitor instructions.  Clean area with 4 enclosed alcohol pads. Let dry.  Apply patch as indicated in monitor instructions. Patch will be placed under collarbone on left  side of chest with arrow pointing upward.  Rub patch adhesive wings for 2 minutes. Remove white label marked "1". Remove the white  label marked "2". Rub patch adhesive wings for 2 additional minutes.  While looking in a mirror, press and release button in center of patch. A small green light will  flash 3-4 times. This will be your only indicator that the monitor has been turned on.  Do not shower for the first 24 hours. You may shower after the first 24 hours.  Press the button if you feel a symptom. You will hear a small click. Record Date, Time and  Symptom in the Patient Logbook.  When you are ready to remove the patch, follow instructions on the last 2 pages of Patient  Logbook. Stick patch monitor onto the last page of Patient Logbook.  Place Patient Logbook in the blue and white box. Use locking tab on box and tape box closed  securely. The blue and white box has prepaid postage on it. Please place it in the mailbox as  soon as possible. Your physician should have your test results approximately 7 days after the  monitor [facility]  Call IMiami Shoresat [phone] you have questions regarding  your ZIO XT patch monitor. Call them immediately if you see an orange light blinking on your  monitor.  If your monitor falls off in less than 4 days, contact our Monitor department at [phone]  If your monitor becomes loose or falls off after 4 days call [employer] at [phone]  suggestions on securing your monitor

## 2021-05-17 NOTE — Progress Notes (Unsigned)
Enrolled patient for a 14 day Zio XT  monitor to be mailed to patients home  °

## 2021-05-21 DIAGNOSIS — R002 Palpitations: Secondary | ICD-10-CM | POA: Diagnosis not present

## 2021-06-06 IMAGING — CR CHEST - 2 VIEW
2 series · 2 of 2 positions shown · non-contrast
Comparison: 10/19/2016

CLINICAL DATA: Cough, chest pain

EXAM:
CHEST - 2 VIEW

[chest pa]
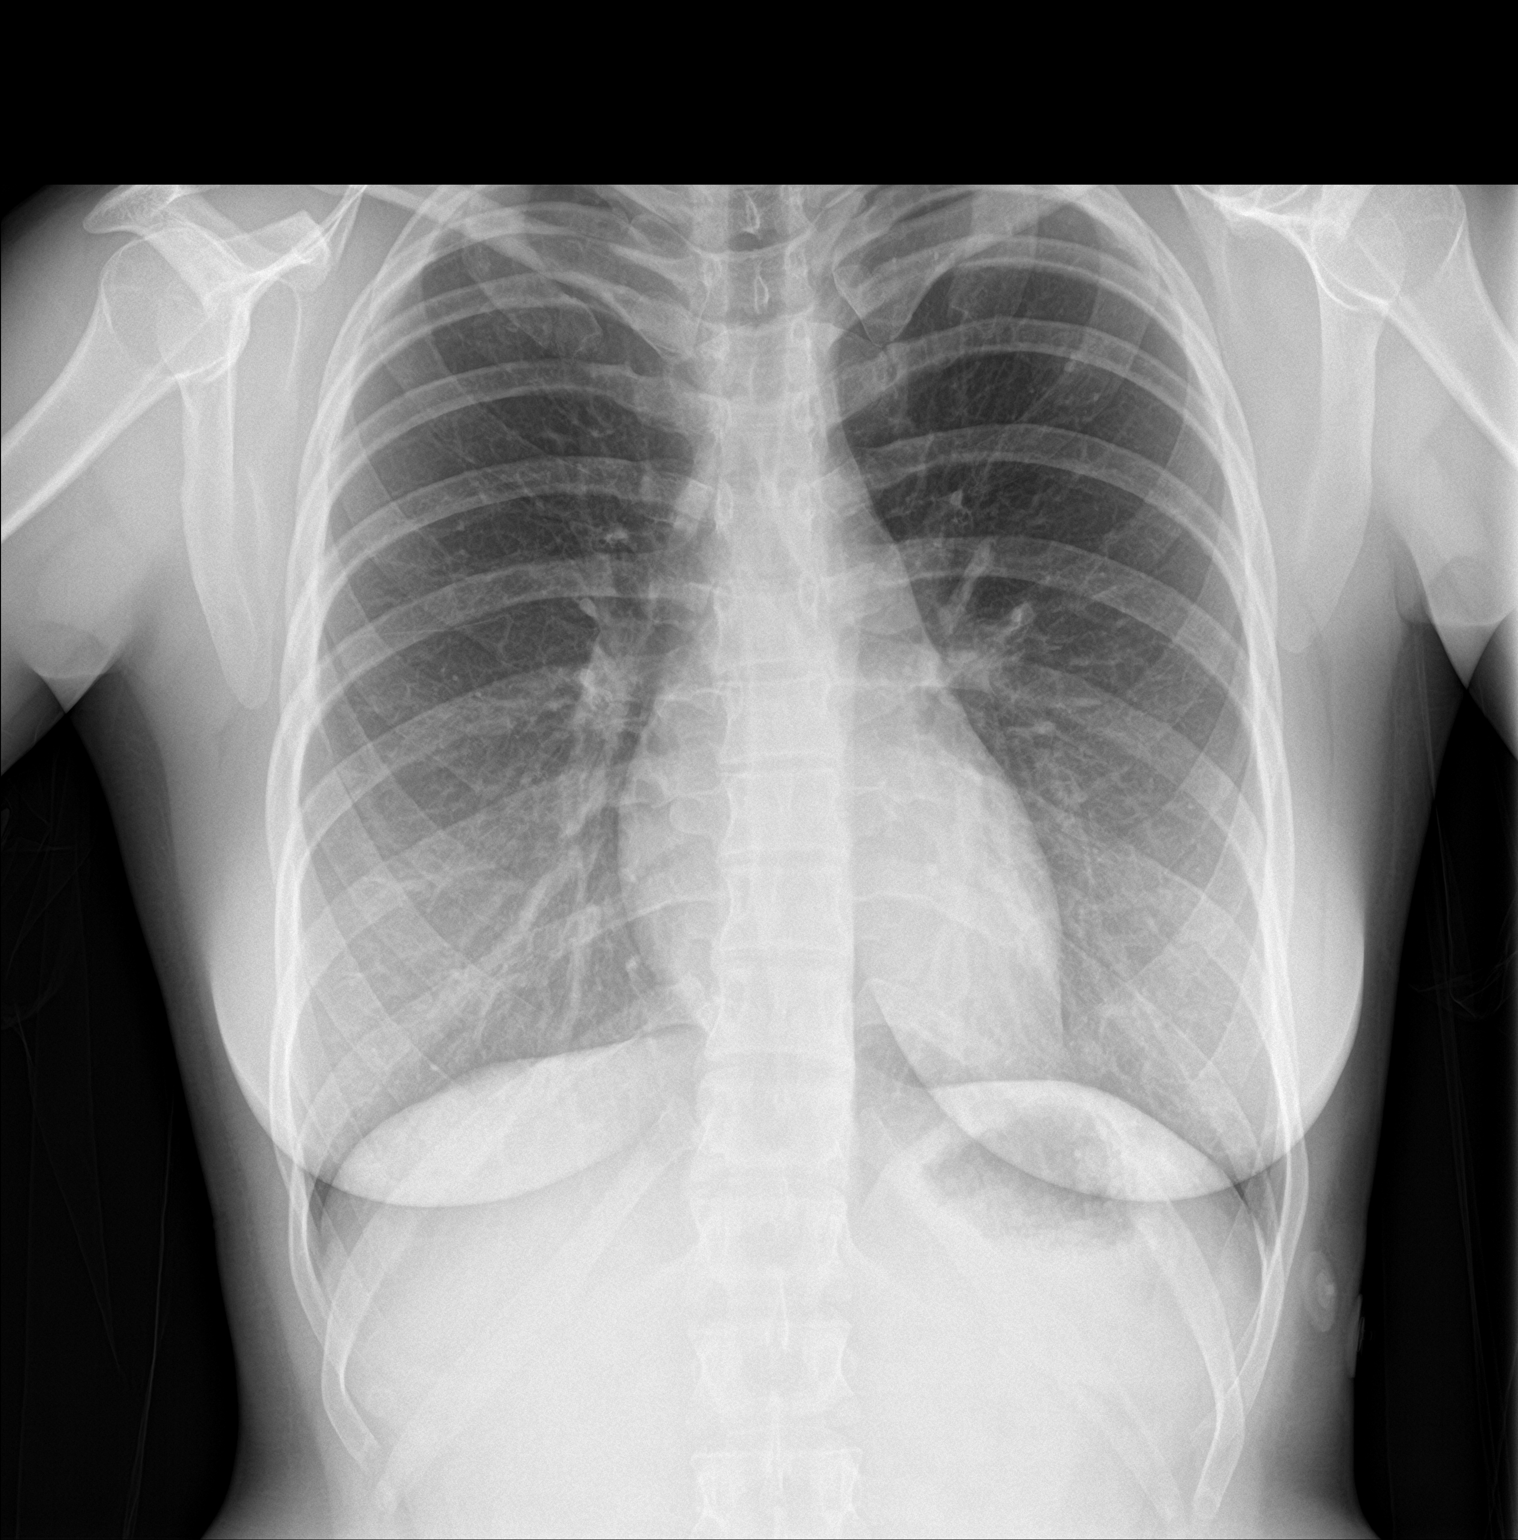

[chest lat]
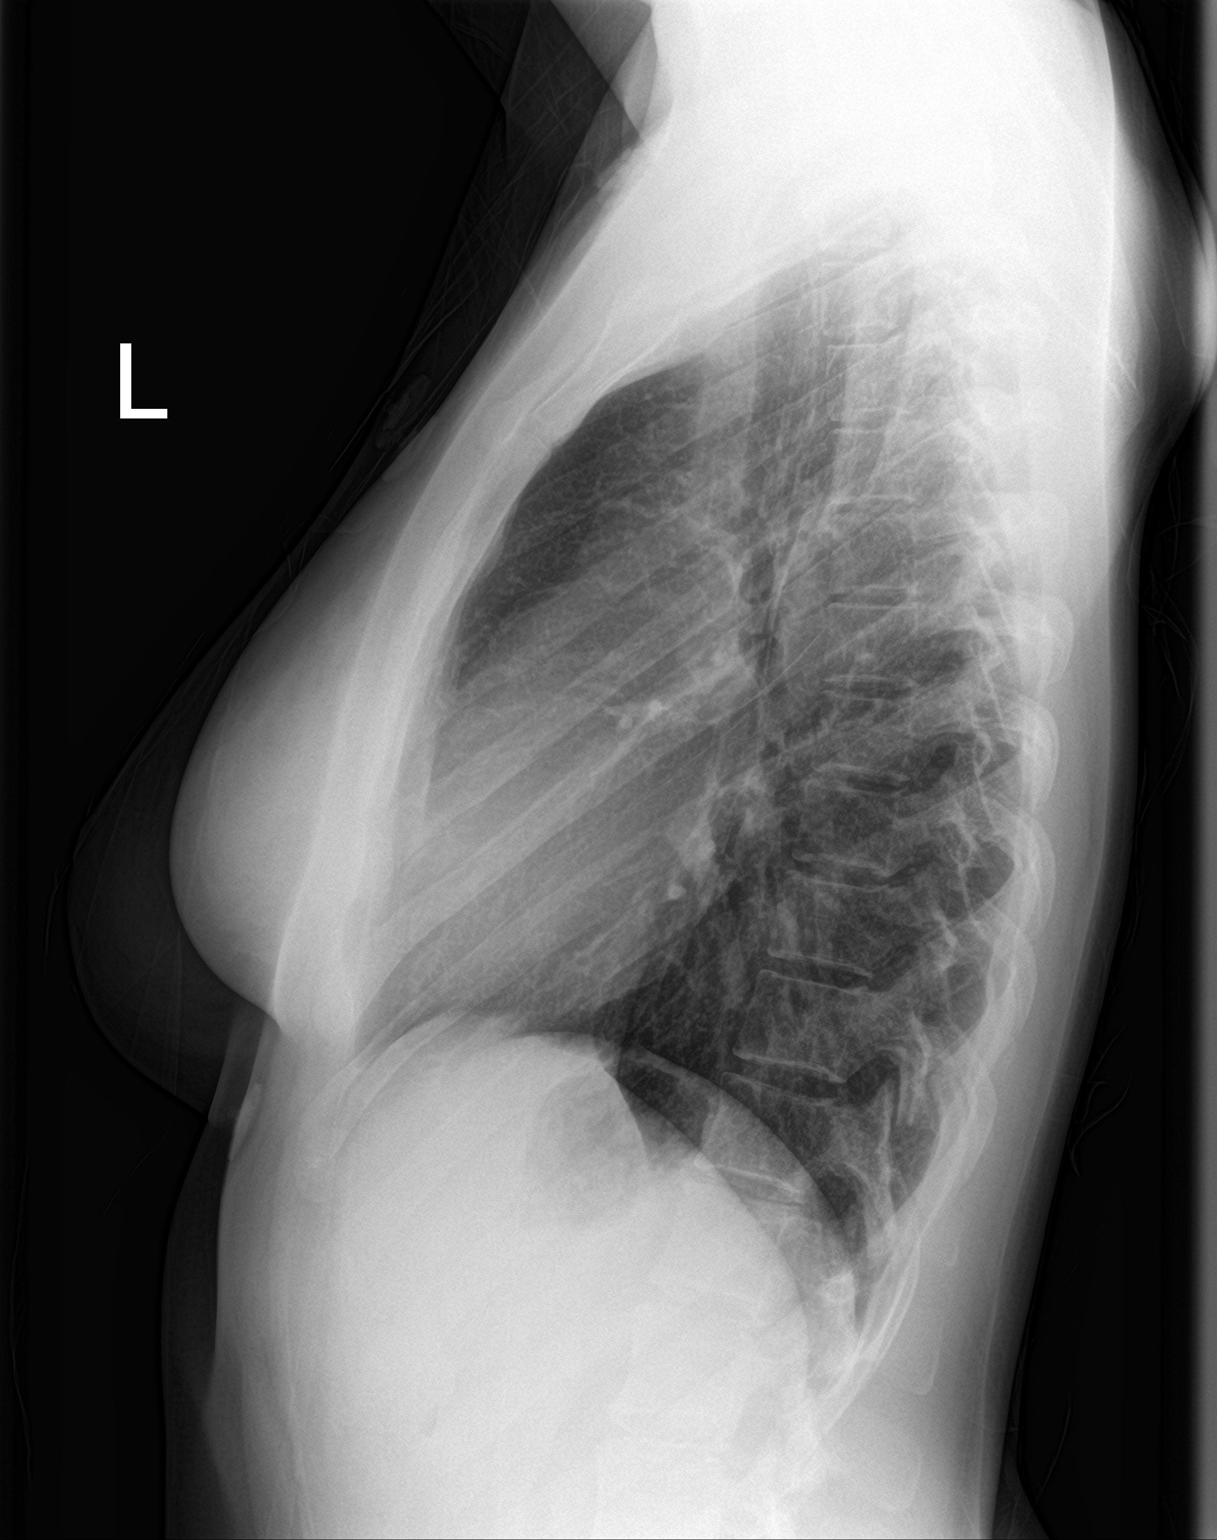

[2 of 2 positions shown; findings below may reference images not displayed]

FINDINGS: The heart size and mediastinal contours are within normal limits.
Both lungs are clear. The visualized skeletal structures are
unremarkable.
IMPRESSION: No acute abnormality of the lungs.

## 2021-09-18 ENCOUNTER — Ambulatory Visit (INDEPENDENT_AMBULATORY_CARE_PROVIDER_SITE_OTHER): Payer: BLUE CROSS/BLUE SHIELD | Admitting: Family

## 2021-09-18 ENCOUNTER — Other Ambulatory Visit: Payer: Self-pay

## 2021-09-18 ENCOUNTER — Encounter: Payer: Self-pay | Admitting: Family

## 2021-09-18 VITALS — BP 120/80 | HR 94 | Temp 97.6°F | Ht 65.0 in | Wt 141.6 lb

## 2021-09-18 DIAGNOSIS — Z0000 Encounter for general adult medical examination without abnormal findings: Secondary | ICD-10-CM

## 2021-09-18 DIAGNOSIS — Z13220 Encounter for screening for lipoid disorders: Secondary | ICD-10-CM

## 2021-09-18 MED ORDER — ALBUTEROL SULFATE HFA 108 (90 BASE) MCG/ACT IN AERS: 1.0000 | INHALATION_SPRAY | Freq: Four times a day (QID) | RESPIRATORY_TRACT | 1 refills | Status: AC | PRN

## 2021-09-18 NOTE — Progress Notes (Signed)
Darlene Wright is a 30 y.o. female with the following history as recorded in EpicCare:  Patient Active Problem List   Diagnosis Date Noted   Polycystic ovarian syndrome 07/21/2018   BREAST MASS, LEFT 05/08/2009    Current Outpatient Medications  Medication Sig Dispense Refill   amphetamine-dextroamphetamine (ADDERALL XR) 20 MG 24 hr capsule Take 20 mg by mouth daily.     Biotin 1000 MCG tablet      buPROPion (WELLBUTRIN SR) 150 MG 12 hr tablet Take 150 mg by mouth every morning.     Dapsone 7.5 % GEL SMARTSIG:Sparingly Topical Every Morning     hydrOXYzine (ATARAX/VISTARIL) 10 MG tablet Take 10 mg by mouth 3 (three) times daily as needed.     levonorgestrel-ethinyl estradiol (SEASONALE) 0.15-0.03 MG tablet Take 1 tablet by mouth at bedtime.     omeprazole (PRILOSEC) 20 MG capsule Take 1 capsule (20 mg total) by mouth daily. 30 capsule 11   spironolactone (ALDACTONE) 25 MG tablet Take 50 mg by mouth daily.     tretinoin (RETIN-A) 0.025 % cream Apply topically.     VYVANSE 30 MG CHEW Chew 1 tablet by mouth every morning.     albuterol (VENTOLIN HFA) 108 (90 Base) MCG/ACT inhaler Inhale 1-2 puffs into the lungs every 6 (six) hours as needed for wheezing or shortness of breath. 8 g 1   No current facility-administered medications for this visit.    Allergies: Wound dressing adhesive  Past Medical History:  Diagnosis Date   Anxiety    Depression    Fibroadenoma of both breasts    GERD (gastroesophageal reflux disease)    Palpitations    PCOS (polycystic ovarian syndrome)     Past Surgical History:  Procedure Laterality Date   WISDOM TOOTH EXTRACTION     WISDOM TOOTH EXTRACTION      Family History  Problem Relation Age of Onset   Hypertension Mother    Alcohol abuse Mother    Hypertension Father    Alcohol abuse Father    Diabetes Maternal Grandmother    Hypertension Maternal Grandfather    Hypertension Paternal Grandmother    Leukemia Paternal Grandfather    Diabetes  Paternal Grandfather    Hypertension Paternal Grandfather    Polycystic ovary syndrome Sister    Post-traumatic stress disorder Sister    Bronchitis Sister    Pneumonia Sister    Colon cancer Neg Hx    Esophageal cancer Neg Hx    Rectal cancer Neg Hx    Stomach cancer Neg Hx     Social History   Tobacco Use   Smoking status: Never    Passive exposure: Yes   Smokeless tobacco: Never  Substance Use Topics   Alcohol use: Yes    Comment: social    Subjective:   Presents for yearly CPE; biometric screening form is required for employer; Continuing to work with GYN and psychiatrist;  Has been working on drinking more water and has been feeling much better;   Review of Systems  Constitutional: Negative.   HENT: Negative.    Eyes: Negative.   Respiratory: Negative.    Cardiovascular: Negative.   Gastrointestinal: Negative.   Genitourinary: Negative.   Musculoskeletal: Negative.   Skin: Negative.   Neurological: Negative.   Endo/Heme/Allergies: Negative.   Psychiatric/Behavioral: Negative.       Objective:  Vitals:   09/18/21 1513  BP: 120/80  Pulse: 94  Temp: 97.6 F (36.4 C)  TempSrc: Oral  SpO2: 97%  Weight: 141 lb 9.6 oz (64.2 kg)  Height: '5\' 5"'  (1.651 m)    General: Well developed, well nourished, in no acute distress  Skin : Warm and dry.  Head: Normocephalic and atraumatic  Eyes: Sclera and conjunctiva clear; pupils round and reactive to light; extraocular movements intact  Ears: External normal; canals clear; tympanic membranes normal  Oropharynx: Pink, supple. No suspicious lesions  Neck: Supple without thyromegaly, adenopathy  Lungs: Respirations unlabored; clear to auscultation bilaterally without wheeze, rales, rhonchi  CVS exam: normal rate and regular rhythm.  Abdomen: Soft; nontender; nondistended; normoactive bowel sounds; no masses or hepatosplenomegaly  Musculoskeletal: No deformities; no active joint inflammation  Extremities: No edema,  cyanosis, clubbing  Vessels: Symmetric bilaterally  Neurologic: Alert and oriented; speech intact; face symmetrical; moves all extremities well; CNII-XII intact without focal deficit   Assessment:  1. PE (physical exam), annual   2. Lipid screening     Plan:   Age appropriate preventive healthcare needs addressed; encouraged regular eye doctor and dental exams; encouraged regular exercise; will update labs and refills as needed today; Follow up in 1 year, sooner prn.   This visit occurred during the SARS-CoV-2 public health emergency.  Safety protocols were in place, including screening questions prior to the visit, additional usage of staff PPE, and extensive cleaning of exam room while observing appropriate contact time as indicated for disinfecting solutions.     No follow-ups on file.  Orders Placed This Encounter  Procedures   CBC with Differential/Platelet   Comp Met (CMET)   Lipid panel   TSH    Requested Prescriptions   Signed Prescriptions Disp Refills   albuterol (VENTOLIN HFA) 108 (90 Base) MCG/ACT inhaler 8 g 1    Sig: Inhale 1-2 puffs into the lungs every 6 (six) hours as needed for wheezing or shortness of breath.

## 2021-09-19 ENCOUNTER — Telehealth: Payer: Self-pay

## 2021-09-19 ENCOUNTER — Telehealth: Payer: BLUE CROSS/BLUE SHIELD | Admitting: Physician Assistant

## 2021-09-19 DIAGNOSIS — U071 COVID-19: Secondary | ICD-10-CM | POA: Diagnosis not present

## 2021-09-19 LAB — CBC WITH DIFFERENTIAL/PLATELET
Basophils Absolute: 0.1 10*3/uL (ref 0.0–0.1)
Basophils Relative: 1 % (ref 0.0–3.0)
Eosinophils Absolute: 0.2 10*3/uL (ref 0.0–0.7)
Eosinophils Relative: 3.6 % (ref 0.0–5.0)
HCT: 40.9 % (ref 36.0–46.0)
Hemoglobin: 13.7 g/dL (ref 12.0–15.0)
Lymphocytes Relative: 26.8 % (ref 12.0–46.0)
Lymphs Abs: 1.8 10*3/uL (ref 0.7–4.0)
MCHC: 33.4 g/dL (ref 30.0–36.0)
MCV: 88.9 fl (ref 78.0–100.0)
Monocytes Absolute: 0.6 10*3/uL (ref 0.1–1.0)
Monocytes Relative: 9.5 % (ref 3.0–12.0)
Neutro Abs: 4 10*3/uL (ref 1.4–7.7)
Neutrophils Relative %: 59.1 % (ref 43.0–77.0)
Platelets: 254 10*3/uL (ref 150.0–400.0)
RBC: 4.6 Mil/uL (ref 3.87–5.11)
RDW: 12.5 % (ref 11.5–15.5)
WBC: 6.7 10*3/uL (ref 4.0–10.5)

## 2021-09-19 LAB — LIPID PANEL
Cholesterol: 154 mg/dL (ref 0–200)
HDL: 66.6 mg/dL (ref >39.00)
LDL Cholesterol: 75 mg/dL (ref 0–99)
NonHDL: 87.52
Total CHOL/HDL Ratio: 2
Triglycerides: 61 mg/dL (ref 0.0–149.0)
VLDL: 12.2 mg/dL (ref 0.0–40.0)

## 2021-09-19 LAB — COMPREHENSIVE METABOLIC PANEL
ALT: 11 U/L (ref 0–35)
AST: 13 U/L (ref 0–37)
Albumin: 4.3 g/dL (ref 3.5–5.2)
Alkaline Phosphatase: 62 U/L (ref 39–117)
BUN: 10 mg/dL (ref 6–23)
CO2: 27 mEq/L (ref 19–32)
Calcium: 10.4 mg/dL (ref 8.4–10.5)
Chloride: 100 mEq/L (ref 96–112)
Creatinine, Ser: 0.89 mg/dL (ref 0.40–1.20)
GFR: 88.28 mL/min (ref >60.00)
Glucose, Bld: 72 mg/dL (ref 70–99)
Potassium: 4 mEq/L (ref 3.5–5.1)
Sodium: 137 mEq/L (ref 135–145)
Total Bilirubin: 0.4 mg/dL (ref 0.2–1.2)
Total Protein: 7.5 g/dL (ref 6.0–8.3)

## 2021-09-19 LAB — TSH: TSH: 1.45 u[IU]/mL (ref 0.35–5.50)

## 2021-09-19 MED ORDER — ONDANSETRON 4 MG PO TBDP: 4.0000 mg | ORAL_TABLET | Freq: Three times a day (TID) | ORAL | 0 refills | Status: DC | PRN

## 2021-09-19 NOTE — Progress Notes (Signed)
Virtual Visit Consent   Darlene Wright, you are scheduled for a virtual visit with a [place] provider today.     Just as with appointments in the office, your consent must be obtained to participate.  Your consent will be active for this visit and any virtual visit you may have with one of our providers in the next 365 days.     If you have a MyChart account, a copy of this consent can be sent to you electronically.  All virtual visits are billed to your insurance company just like a traditional visit in the office.    As this is a virtual visit, video technology does not allow for your provider to perform a traditional examination.  This may limit your provider's ability to fully assess your condition.  If your provider identifies any concerns that need to be evaluated in person or the need to arrange testing (such as labs, EKG, etc.), we will make arrangements to do so.     Although advances in technology are sophisticated, we cannot ensure that it will always work on either your end or our end.  If the connection with a video visit is poor, the visit may have to be switched to a telephone visit.  With either a video or telephone visit, we are not always able to ensure that we have a secure connection.     I need to obtain your verbal consent now.   Are you willing to proceed with your visit today?    ALYSSANDRA HULSEBUS has provided verbal consent on 09/19/2021 for a virtual visit (video or telephone).   Darlene Wright, [place]   Date: 09/19/2021 7:10 PM   Virtual Visit via Video Note   I, Darlene Wright, connected with  ADWOA AXE  ([id number], May 20, 1991) on 09/19/21 at  7:15 PM EST by a video-enabled telemedicine application and verified that I am speaking with the correct person using two identifiers.  Location: Patient: Virtual Visit Location Patient: Home Provider: Virtual Visit Location Provider: Home Office   I discussed the limitations of evaluation and management  by telemedicine and the availability of in person appointments. The patient expressed understanding and agreed to proceed.    History of Present Illness: Darlene Wright is a 30 y.o. who identifies as a female who was assigned female at birth, and is being seen today for COVID-19. Notes late last night she developed headaches and body aches. About 3 am she woke up with chills/sweats/hot flashes. Did not check temperature at that time. Had one episode of vomiting overnight. Was finally able to go back to sleep. Woke up still feeling bad. Thought initially was the flu but took a COVID test today which was positive. Is still not having any significant URI symptoms. Still having more consistent nausea but is able to remain hydrated. Not eating much. Tmax > 99 which is "unheard of" for her. She notes she has been vaccinated but has not been boosted.   HPI: HPI  Problems:  Patient Active Problem List   Diagnosis Date Noted   Polycystic ovarian syndrome 07/21/2018   BREAST MASS, LEFT 05/08/2009    Allergies:  Allergies  Allergen Reactions   Wound Dressing Adhesive Dermatitis    Found when she had to use a Holter monitor. Unable to wear due to blisters    Medications:  Current Outpatient Medications:    albuterol (VENTOLIN HFA) 108 (90 Base) MCG/ACT inhaler, Inhale 1-2 puffs into the lungs  every 6 (six) hours as needed for wheezing or shortness of breath., Disp: 8 g, Rfl: 1   amphetamine-dextroamphetamine (ADDERALL XR) 20 MG 24 hr capsule, Take 20 mg by mouth daily., Disp: , Rfl:    Biotin 1000 MCG tablet, , Disp: , Rfl:    buPROPion (WELLBUTRIN SR) 150 MG 12 hr tablet, Take 150 mg by mouth every morning., Disp: , Rfl:    Dapsone 7.5 % GEL, SMARTSIG:Sparingly Topical Every Morning, Disp: , Rfl:    hydrOXYzine (ATARAX/VISTARIL) 10 MG tablet, Take 10 mg by mouth 3 (three) times daily as needed., Disp: , Rfl:    levonorgestrel-ethinyl estradiol (SEASONALE) 0.15-0.03 MG tablet, Take 1 tablet by mouth  at bedtime., Disp: , Rfl:    omeprazole (PRILOSEC) 20 MG capsule, Take 1 capsule (20 mg total) by mouth daily., Disp: 30 capsule, Rfl: 11   spironolactone (ALDACTONE) 25 MG tablet, Take 50 mg by mouth daily., Disp: , Rfl:    tretinoin (RETIN-A) 0.025 % cream, Apply topically., Disp: , Rfl:    VYVANSE 30 MG CHEW, Chew 1 tablet by mouth every morning., Disp: , Rfl:   Observations/Objective: Patient is well-developed, well-nourished in no acute distress.  Resting comfortably at home.  Head is normocephalic, atraumatic.  No labored breathing. Speech is clear and coherent with logical content.  Patient is alert and oriented at baseline.   Assessment and Plan: 1. COVID-19 Milder symptoms. Lower risk of complications - risk score of 0. No indication for antiviral at present time as risk of ADR from medication > likely benefit. Zofran per orders. Supportive measures, OTC medications and Vitamin regimen reviewed. Patient enrolled in [place] monitoring program through [place]. Strict ER precautions reviewed.   Follow Up Instructions: I discussed the assessment and treatment plan with the patient. The patient was provided an opportunity to ask questions and all were answered. The patient agreed with the plan and demonstrated an understanding of the instructions.  A copy of instructions were sent to the patient via MyChart unless otherwise noted below.   The patient was advised to call back or seek an in-person evaluation if the symptoms worsen or if the condition fails to improve as anticipated.  Time:  I spent 12 minutes with the patient via telehealth technology discussing the above problems/concerns.    Darlene Rio, PA-C

## 2021-09-19 NOTE — Telephone Encounter (Signed)
Called pt regarding COVID S/S.  [facility] requesting a call back.

## 2021-09-19 NOTE — Patient Instructions (Addendum)
Darlene Wright, thank you for joining Leeanne Rio, PA-C for today's virtual visit.  While this provider is not your primary care provider (PCP), if your PCP is located in our provider database this encounter information will be shared with them immediately following your visit.  Consent: (Patient) Darlene Wright provided verbal consent for this virtual visit at the beginning of the encounter.  Current Medications:  Current Outpatient Medications:    albuterol (VENTOLIN HFA) 108 (90 Base) MCG/ACT inhaler, Inhale 1-2 puffs into the lungs every 6 (six) hours as needed for wheezing or shortness of breath., Disp: 8 g, Rfl: 1   amphetamine-dextroamphetamine (ADDERALL XR) 20 MG 24 hr capsule, Take 20 mg by mouth daily., Disp: , Rfl:    Biotin 1000 MCG tablet, , Disp: , Rfl:    buPROPion (WELLBUTRIN SR) 150 MG 12 hr tablet, Take 150 mg by mouth every morning., Disp: , Rfl:    Dapsone 7.5 % GEL, SMARTSIG:Sparingly Topical Every Morning, Disp: , Rfl:    hydrOXYzine (ATARAX/VISTARIL) 10 MG tablet, Take 10 mg by mouth 3 (three) times daily as needed., Disp: , Rfl:    levonorgestrel-ethinyl estradiol (SEASONALE) 0.15-0.03 MG tablet, Take 1 tablet by mouth at bedtime., Disp: , Rfl:    omeprazole (PRILOSEC) 20 MG capsule, Take 1 capsule (20 mg total) by mouth daily., Disp: 30 capsule, Rfl: 11   spironolactone (ALDACTONE) 25 MG tablet, Take 50 mg by mouth daily., Disp: , Rfl:    tretinoin (RETIN-A) 0.025 % cream, Apply topically., Disp: , Rfl:    VYVANSE 30 MG CHEW, Chew 1 tablet by mouth every morning., Disp: , Rfl:    Medications ordered in this encounter:  No orders of the defined types were placed in this encounter.    *If you need refills on other medications prior to your next appointment, please contact your pharmacy*  Follow-Up: Call back or seek an in-person evaluation if the symptoms worsen or if the condition fails to improve as anticipated.  Other Instructions Please keep  well-hydrated and get plenty of rest. Start a saline nasal rinse to flush out your nasal passages. You can use plain Mucinex to help thin congestion. If you have a humidifier, running in the bedroom at night. I want you to start OTC vitamin D3 1000 units daily, vitamin C 1000 mg daily, and a zinc supplement. Please take prescribed medications as directed.  You have been enrolled in a MyChart symptom monitoring program. Please answer these questions daily so we can keep track of how you are doing.  You were to quarantine for 5 days from onset of your symptoms.  After day 5, if you have had no fever and you are feeling better, you can end quarantine but need to mask for an additional 5 days. After day 5 if you have a fever or are having significant symptoms, please quarantine for full 10 days.  If you note any worsening of symptoms, any significant shortness of breath or any chest pain, please seek ER evaluation ASAP.  Please do not delay care!  COVID-19: What to Do if You Are Sick If you test positive and are an older adult or someone who is at high risk of getting very sick from COVID-19, treatment may be available. Contact a healthcare provider right away after a positive test to determine if you are eligible, even if your symptoms are mild right now. You can also visit a Test to Treat location and, if eligible, receive a prescription from  a provider. Don't delay: Treatment must be started within the first few days to be effective. If you have a fever, cough, or other symptoms, you might have COVID-19. Most people have mild illness and are able to recover at home. If you are sick: Keep track of your symptoms. If you have an emergency warning sign (including trouble breathing), call 911. Steps to help prevent the spread of COVID-19 if you are sick If you are sick with COVID-19 or think you might have COVID-19, follow the steps below to care for yourself and to help protect other people in your  home and community. Stay home except to get medical care Stay home. Most people with COVID-19 have mild illness and can recover at home without medical care. Do not leave your home, except to get medical care. Do not visit public areas and do not go to places where you are unable to wear a mask. Take care of yourself. Get rest and stay hydrated. Take over-the-counter medicines, such as acetaminophen, to help you feel better. Stay in touch with your doctor. Call before you get medical care. Be sure to get care if you have trouble breathing, or have any other emergency warning signs, or if you think it is an emergency. Avoid public transportation, ride-sharing, or taxis if possible. Get tested If you have symptoms of COVID-19, get tested. While waiting for test results, stay away from others, including staying apart from those living in your household. Get tested as soon as possible after your symptoms start. Treatments may be available for people with COVID-19 who are at risk for becoming very sick. Don't delay: Treatment must be started early to be effective--some treatments must begin within 5 days of your first symptoms. Contact your healthcare provider right away if your test result is positive to determine if you are eligible. Self-tests are one of several options for testing for the virus that causes COVID-19 and may be more convenient than laboratory-based tests and point-of-care tests. Ask your healthcare provider or your local health department if you need help interpreting your test results. You can visit your state, tribal, local, and territorial health department's website to look for the latest local information on testing sites. Separate yourself from other people As much as possible, stay in a specific room and away from other people and pets in your home. If possible, you should use a separate bathroom. If you need to be around other people or animals in or outside of the home, wear a  well-fitting mask. Tell your close contacts that they may have been exposed to COVID-19. An infected person can spread COVID-19 starting 48 hours (or 2 days) before the person has any symptoms or tests positive. By letting your close contacts know they may have been exposed to COVID-19, you are helping to protect everyone. See COVID-19 and Animals if you have questions about pets. If you are diagnosed with COVID-19, someone from the health department may call you. Answer the call to slow the spread. Monitor your symptoms Symptoms of COVID-19 include fever, cough, or other symptoms. Follow care instructions from your healthcare provider and local health department. Your local health authorities may give instructions on checking your symptoms and reporting information. When to seek emergency medical attention Look for emergency warning signs* for COVID-19. If someone is showing any of these signs, seek emergency medical care immediately: Trouble breathing Persistent pain or pressure in the chest New confusion Inability to wake or stay awake Pale, gray, or blue-colored  skin, lips, or nail beds, depending on skin tone *This list is not all possible symptoms. Please call your medical provider for any other symptoms that are severe or concerning to you. Call 911 or call ahead to your local emergency facility: Notify the operator that you are seeking care for someone who has or may have COVID-19. Call ahead before visiting your doctor Call ahead. Many medical visits for routine care are being postponed or done by phone or telemedicine. If you have a medical appointment that cannot be postponed, call your doctor's office, and tell them you have or may have COVID-19. This will help the office protect themselves and other patients. If you are sick, wear a well-fitting mask You should wear a mask if you must be around other people or animals, including pets (even at home). Wear a mask with the best fit,  protection, and comfort for you. You don't need to wear the mask if you are alone. If you can't put on a mask (because of trouble breathing, for example), cover your coughs and sneezes in some other way. Try to stay at least 6 feet away from other people. This will help protect the people around you. Masks should not be placed on young children under age 46 years, anyone who has trouble breathing, or anyone who is not able to remove the mask without help. Cover your coughs and sneezes Cover your mouth and nose with a tissue when you cough or sneeze. Throw away used tissues in a lined trash can. Immediately wash your hands with soap and water for at least 20 seconds. If soap and water are not available, clean your hands with an alcohol-based hand sanitizer that contains at least 60% alcohol. Clean your hands often Wash your hands often with soap and water for at least 20 seconds. This is especially important after blowing your nose, coughing, or sneezing; going to the bathroom; and before eating or preparing food. Use hand sanitizer if soap and water are not available. Use an alcohol-based hand sanitizer with at least 60% alcohol, covering all surfaces of your hands and rubbing them together until they feel dry. Soap and water are the best option, especially if hands are visibly dirty. Avoid touching your eyes, nose, and mouth with unwashed hands. Handwashing Tips Avoid sharing personal household items Do not share dishes, drinking glasses, cups, eating utensils, towels, or bedding with other people in your home. Wash these items thoroughly after using them with soap and water or put in the dishwasher. Clean surfaces in your home regularly Clean and disinfect high-touch surfaces (for example, doorknobs, tables, handles, light switches, and countertops) in your "sick room" and bathroom. In shared spaces, you should clean and disinfect surfaces and items after each use by the person who is ill. If you  are sick and cannot clean, a caregiver or other person should only clean and disinfect the area around you (such as your bedroom and bathroom) on an as needed basis. Your caregiver/other person should wait as long as possible (at least several hours) and wear a mask before entering, cleaning, and disinfecting shared spaces that you use. Clean and disinfect areas that may have blood, stool, or body fluids on them. Use household cleaners and disinfectants. Clean visible dirty surfaces with household cleaners containing soap or detergent. Then, use a household disinfectant. Use a product from [employer] List N: Disinfectants for Coronavirus (ASNKN-39). Be sure to follow the instructions on the label to ensure safe and effective use of  the product. Many products recommend keeping the surface wet with a disinfectant for a certain period of time (look at "contact time" on the product label). You may also need to wear personal protective equipment, such as gloves, depending on the directions on the product label. Immediately after disinfecting, wash your hands with soap and water for 20 seconds. For completed guidance on cleaning and disinfecting your home, visit Complete Disinfection Guidance. Take steps to improve ventilation at home Improve ventilation (air flow) at home to help prevent from spreading COVID-19 to other people in your household. Clear out COVID-19 virus particles in the air by opening windows, using air filters, and turning on fans in your home. Use this interactive tool to learn how to improve air flow in your home. When you can be around others after being sick with COVID-19 Deciding when you can be around others is different for different situations. Find out when you can safely end home isolation. For any additional questions about your care, contact your healthcare provider or state or local health department. 01/09/2021 Content source: [facility] Immun[facility] Res[facility]  Diseases ([facility]), Division of Viral Diseases This information is not intended to replace advice given to you by your health care provider. Make sure you discuss any questions you have with your health care provider. Document Revised: 02/22/2021 Document Reviewed: 02/22/2021 Elsevier Patient Education  2022 [employer].  If you have been instructed to have an in-person evaluation today at a local Urgent Care facility, please use the link below. It will take you to a list of all of our available [facility], including address, phone number and hours of operation. Please do not delay care.  [facility]  If you or a family member do not have a primary care provider, use the link below to schedule a visit and establish care. When you choose a  [facility] primary care physician or advanced practice provider, you gain a long-term partner in health. Find a Primary Care Provider  Learn more about 's in-office and virtual care options: [facility]

## 2021-09-21 ENCOUNTER — Encounter: Payer: Self-pay | Admitting: Family

## 2021-09-21 NOTE — Progress Notes (Signed)
PAP 10/2018 abstracted.

## 2021-09-25 NOTE — Progress Notes (Signed)
Lab Letter sent  

## 2021-10-21 HISTORY — PX: LIPOMA EXCISION: SHX5283

## 2021-11-20 DIAGNOSIS — D225 Melanocytic nevi of trunk: Secondary | ICD-10-CM | POA: Diagnosis not present

## 2021-11-20 DIAGNOSIS — L910 Hypertrophic scar: Secondary | ICD-10-CM | POA: Diagnosis not present

## 2021-11-20 DIAGNOSIS — L821 Other seborrheic keratosis: Secondary | ICD-10-CM | POA: Diagnosis not present

## 2021-11-20 DIAGNOSIS — L814 Other melanin hyperpigmentation: Secondary | ICD-10-CM | POA: Diagnosis not present

## 2021-12-05 DIAGNOSIS — F419 Anxiety disorder, unspecified: Secondary | ICD-10-CM | POA: Diagnosis not present

## 2021-12-05 DIAGNOSIS — F902 Attention-deficit hyperactivity disorder, combined type: Secondary | ICD-10-CM | POA: Diagnosis not present

## 2021-12-05 DIAGNOSIS — Z79899 Other long term (current) drug therapy: Secondary | ICD-10-CM | POA: Diagnosis not present

## 2022-02-12 ENCOUNTER — Ambulatory Visit: Payer: BLUE CROSS/BLUE SHIELD | Admitting: Family

## 2022-02-12 VITALS — BP 110/60 | HR 91 | Temp 98.0°F | Resp 18 | Ht 65.0 in | Wt 145.2 lb

## 2022-02-12 DIAGNOSIS — T148XXA Other injury of unspecified body region, initial encounter: Secondary | ICD-10-CM

## 2022-02-12 LAB — CBC WITH DIFFERENTIAL/PLATELET
Basophils Absolute: 0 10*3/uL (ref 0.0–0.1)
Basophils Relative: 0.7 % (ref 0.0–3.0)
Eosinophils Absolute: 0.2 10*3/uL (ref 0.0–0.7)
Eosinophils Relative: 3.2 % (ref 0.0–5.0)
HCT: 41.2 % (ref 36.0–46.0)
Hemoglobin: 13.8 g/dL (ref 12.0–15.0)
Lymphocytes Relative: 29.1 % (ref 12.0–46.0)
Lymphs Abs: 1.9 10*3/uL (ref 0.7–4.0)
MCHC: 33.6 g/dL (ref 30.0–36.0)
MCV: 89.3 fl (ref 78.0–100.0)
Monocytes Absolute: 0.6 10*3/uL (ref 0.1–1.0)
Monocytes Relative: 9 % (ref 3.0–12.0)
Neutro Abs: 3.7 10*3/uL (ref 1.4–7.7)
Neutrophils Relative %: 58 % (ref 43.0–77.0)
Platelets: 250 10*3/uL (ref 150.0–400.0)
RBC: 4.61 Mil/uL (ref 3.87–5.11)
RDW: 12.5 % (ref 11.5–15.5)
WBC: 6.4 10*3/uL (ref 4.0–10.5)

## 2022-02-12 LAB — COMPREHENSIVE METABOLIC PANEL
ALT: 11 U/L (ref 0–35)
AST: 14 U/L (ref 0–37)
Albumin: 4 g/dL (ref 3.5–5.2)
Alkaline Phosphatase: 61 U/L (ref 39–117)
BUN: 16 mg/dL (ref 6–23)
CO2: 25 mEq/L (ref 19–32)
Calcium: 9.4 mg/dL (ref 8.4–10.5)
Chloride: 102 mEq/L (ref 96–112)
Creatinine, Ser: 0.74 mg/dL (ref 0.40–1.20)
GFR: 109.86 mL/min (ref >60.00)
Glucose, Bld: 85 mg/dL (ref 70–99)
Potassium: 3.9 mEq/L (ref 3.5–5.1)
Sodium: 135 mEq/L (ref 135–145)
Total Bilirubin: 0.4 mg/dL (ref 0.2–1.2)
Total Protein: 7.2 g/dL (ref 6.0–8.3)

## 2022-02-12 NOTE — Progress Notes (Signed)
?[name] is a 31 y.o. female with the following history as recorded in EpicCare:  ?Patient Active Problem List  ? Diagnosis Date Noted  ? Polycystic ovarian syndrome 07/21/2018  ? BREAST MASS, LEFT 05/08/2009  ?  ?Current Outpatient Medications  ?Medication Sig Dispense Refill  ? albuterol (VENTOLIN HFA) 108 (90 Base) MCG/ACT inhaler Inhale 1-2 puffs into the lungs every 6 (six) hours as needed for wheezing or shortness of breath. 8 g 1  ? Biotin 1000 MCG tablet Taking 500 mcg    ? Dapsone 7.5 % GEL SMARTSIG:Sparingly Topical Every Morning    ? hydrOXYzine (ATARAX/VISTARIL) 10 MG tablet Take 10 mg by mouth 3 (three) times daily as needed.    ? levonorgestrel-ethinyl estradiol (SEASONALE) 0.15-0.03 MG tablet Take 1 tablet by mouth at bedtime.    ? omeprazole (PRILOSEC) 20 MG capsule Take 1 capsule (20 mg total) by mouth daily. (Patient taking differently: Take 20 mg by mouth as needed.) 30 capsule 11  ? ondansetron (ZOFRAN-ODT) 4 MG disintegrating tablet Take 1 tablet (4 mg total) by mouth every 8 (eight) hours as needed for nausea or vomiting. 20 tablet 0  ? spironolactone (ALDACTONE) 50 MG tablet Take 50 mg by mouth every morning.    ? tretinoin (RETIN-A) 0.05 % cream PLEASE SEE ATTACHED FOR DETAILED DIRECTIONS    ? ?No current facility-administered medications for this visit.  ?  ?Allergies: Wound dressing adhesive  ?Past Medical History:  ?Diagnosis Date  ? Anxiety   ? Depression   ? Fibroadenoma of both breasts   ? GERD (gastroesophageal reflux disease)   ? Palpitations   ? PCOS (polycystic ovarian syndrome)   ?  ?Past Surgical History:  ?Procedure Laterality Date  ? WISDOM TOOTH EXTRACTION    ? WISDOM TOOTH EXTRACTION    ?  ?Family History  ?Problem Relation Age of Onset  ? Hypertension Mother   ? Alcohol abuse Mother   ? Hypertension Father   ? Alcohol abuse Father   ? Diabetes Maternal Grandmother   ? Hypertension Maternal Grandfather   ? Hypertension Paternal Grandmother   ? Leukemia Paternal  Grandfather   ? Diabetes Paternal Grandfather   ? Hypertension Paternal Grandfather   ? Polycystic ovary syndrome Sister   ? Post-traumatic stress disorder Sister   ? Bronchitis Sister   ? Pneumonia Sister   ? Colon cancer Neg Hx   ? Esophageal cancer Neg Hx   ? Rectal cancer Neg Hx   ? Stomach cancer Neg Hx   ?  ?Social History  ? ?Tobacco Use  ? Smoking status: Never  ?  Passive exposure: Yes  ? Smokeless tobacco: Never  ?Substance Use Topics  ? Alcohol use: Yes  ?  Comment: social  ?  ?Subjective:  ?Concerned about unexplained bruising on thigh and abdomen; unsure of  ?source; does not remember any type of injury;  ? ?LMP- continual cycling/ 2 months ago ? ? ? ?Objective:  ?Vitals:  ? 02/12/22 1057  ?BP: 110/60  ?Pulse: 91  ?Resp: 18  ?Temp: 98 ?F (36.7 ?C)  ?TempSrc: Oral  ?SpO2: 98%  ?Weight: 145 lb 3.2 oz (65.9 kg)  ?Height: '5\' 5"'$  (1.651 m)  ?  ?General: Well developed, well nourished, in no acute distress  ?Skin : Warm and dry. Healing bruises noted on inner and outer left and right thighs ?Head: Normocephalic and atraumatic  ?Lungs: Respirations unlabored;  ?Neurologic: Alert and oriented; speech intact; face symmetrical; moves all extremities well; CNII-XII intact without  focal deficit  ?Assessment:  ?1. Bruising   ?  ?Plan:  ?Bruises appear to be healing; will update labs today; follow up to be determined; ? ?This visit occurred during the SARS-CoV-2 public health emergency.  Safety protocols were in place, including screening questions prior to the visit, additional usage of staff PPE, and extensive cleaning of exam room while observing appropriate contact time as indicated for disinfecting solutions.  ? ? ?No follow-ups on file.  ?Orders Placed This Encounter  ?Procedures  ? CBC with Differential/Platelet  ? PT AND PTT  ?  ?Requested Prescriptions  ? ? No prescriptions requested or ordered in this encounter  ?  ? ?

## 2022-02-13 LAB — PT AND PTT
INR: 1 (ref 0.9–1.2)
Prothrombin Time: 10.3 s (ref 9.1–12.0)
aPTT: 29 s (ref 24–33)

## 2022-03-13 DIAGNOSIS — F419 Anxiety disorder, unspecified: Secondary | ICD-10-CM | POA: Diagnosis not present

## 2022-03-13 DIAGNOSIS — Z79899 Other long term (current) drug therapy: Secondary | ICD-10-CM | POA: Diagnosis not present

## 2022-03-13 DIAGNOSIS — F902 Attention-deficit hyperactivity disorder, combined type: Secondary | ICD-10-CM | POA: Diagnosis not present

## 2022-03-21 ENCOUNTER — Ambulatory Visit: Payer: BLUE CROSS/BLUE SHIELD | Admitting: Plastic Surgery

## 2022-03-21 ENCOUNTER — Encounter: Payer: Self-pay | Admitting: Plastic Surgery

## 2022-03-21 DIAGNOSIS — R222 Localized swelling, mass and lump, trunk: Secondary | ICD-10-CM | POA: Insufficient documentation

## 2022-03-21 NOTE — Progress Notes (Signed)
Patient ID: Darlene Wright, female    DOB: [birth year], 31 y.o.   MRN: [id number]   Chief Complaint  Patient presents with   Consult   Skin Problem    The patient is a 31 year old female here for evaluation of a mass on her buttock area.  It is approximately 2 cm and is soft.  It has the consistency of either a soft lipoma or a neurofibroma.  It is on the medial aspect of the right gluteus area.  She does not have any other concerning lesions.  Its been there for at least 2 years and seems to be getting larger so she would like to have it there is no family history of Von Recklinghausen disease ([place]). Nothing makes it better.   Review of Systems  Constitutional: Negative.   Eyes: Negative.   Respiratory: Negative.  Negative for chest tightness and shortness of breath.   Cardiovascular: Negative.  Negative for leg swelling.  Gastrointestinal: Negative.   Endocrine: Negative.   Genitourinary: Negative.   Musculoskeletal: Negative.   Skin: Negative.    Past Medical History:  Diagnosis Date   Anxiety    Depression    Fibroadenoma of both breasts    GERD (gastroesophageal reflux disease)    Palpitations    PCOS (polycystic ovarian syndrome)     Past Surgical History:  Procedure Laterality Date   WISDOM TOOTH EXTRACTION     WISDOM TOOTH EXTRACTION        Current Outpatient Medications:    albuterol (VENTOLIN HFA) 108 (90 Base) MCG/ACT inhaler, Inhale 1-2 puffs into the lungs every 6 (six) hours as needed for wheezing or shortness of breath., Disp: 8 g, Rfl: 1   Biotin 1000 MCG tablet, Taking 500 mcg, Disp: , Rfl:    Dapsone 7.5 % GEL, SMARTSIG:Sparingly Topical Every Morning, Disp: , Rfl:    hydrOXYzine (ATARAX/VISTARIL) 10 MG tablet, Take 10 mg by mouth 3 (three) times daily as needed., Disp: , Rfl:    levonorgestrel-ethinyl estradiol (SEASONALE) 0.15-0.03 MG tablet, Take 1 tablet by mouth at bedtime., Disp: , Rfl:    lisdexamfetamine (VYVANSE) 50 MG capsule, Take 50 mg by  mouth daily., Disp: , Rfl:    omeprazole (PRILOSEC) 20 MG capsule, Take 1 capsule (20 mg total) by mouth daily. (Patient taking differently: Take 20 mg by mouth as needed.), Disp: 30 capsule, Rfl: 11   ondansetron (ZOFRAN-ODT) 4 MG disintegrating tablet, Take 1 tablet (4 mg total) by mouth every 8 (eight) hours as needed for nausea or vomiting., Disp: 20 tablet, Rfl: 0   spironolactone (ALDACTONE) 50 MG tablet, Take 50 mg by mouth every morning., Disp: , Rfl:    tretinoin (RETIN-A) 0.05 % cream, PLEASE SEE ATTACHED FOR DETAILED DIRECTIONS, Disp: , Rfl:    Objective:   Vitals:   03/21/22 1303  BP: 108/72  Pulse: 86  SpO2: 98%    Physical Exam Vitals and nursing note reviewed.  Constitutional:      Appearance: Normal appearance.  HENT:     Head: Normocephalic.  Cardiovascular:     Rate and Rhythm: Normal rate.     Pulses: Normal pulses.  Musculoskeletal:        General: No swelling or deformity.       Legs:  Skin:    General: Skin is warm.     Capillary Refill: Capillary refill takes less than 2 seconds.     Coloration: Skin is not jaundiced.     Findings: Lesion  present. No bruising.  Neurological:     Mental Status: She is alert and oriented to person, place, and time.  Psychiatric:        Mood and Affect: Mood normal.        Behavior: Behavior normal.        Thought Content: Thought content normal.        Judgment: Judgment normal.    Assessment & Plan:  Mass of buttock  Plan for excision of right buttock mass in the office.  Patient was made aware that she may get another one in the future.  Pictures were obtained of the patient and placed in the chart with the patient's or guardian's permission.   Johnson Lane, DO

## 2022-03-22 ENCOUNTER — Institutional Professional Consult (permissible substitution): Payer: BLUE CROSS/BLUE SHIELD | Admitting: Plastic Surgery

## 2022-04-09 ENCOUNTER — Ambulatory Visit: Payer: BLUE CROSS/BLUE SHIELD | Admitting: Plastic Surgery

## 2022-04-09 ENCOUNTER — Other Ambulatory Visit (HOSPITAL_COMMUNITY)
Admission: RE | Admit: 2022-04-09 | Discharge: 2022-04-09 | Disposition: A | Discharge status: Home / Self Care | Payer: BLUE CROSS/BLUE SHIELD | Source: Ambulatory Visit | Attending: Plastic Surgery | Admitting: Plastic Surgery

## 2022-04-09 ENCOUNTER — Encounter: Payer: Self-pay | Admitting: Plastic Surgery

## 2022-04-09 VITALS — BP 126/87 | HR 82 | Ht 65.5 in | Wt 146.0 lb

## 2022-04-09 DIAGNOSIS — R2241 Localized swelling, mass and lump, right lower limb: Secondary | ICD-10-CM | POA: Insufficient documentation

## 2022-04-09 NOTE — Progress Notes (Signed)
Procedure Note  Preoperative Dx: right thigh mass  Postoperative Dx: Same  Procedure: excision of right thigh mass 2 x 3 cm  Anesthesia: Lidocaine 1% with 1:100,000 epinephrine   Description of Procedure: Risks and complications were explained to the patient.  Consent was confirmed and the patient understands the risks and benefits.  The potential complications and alternatives were explained and the patient consents.  The patient expressed understanding the option of not having the procedure and the risks of a scar.  Time out was called and all information was confirmed to be correct.    The area was prepped and drapped.  Lidocaine 1% with epinepherine was injected in the subcutaneous area.  After waiting several minutes for the local to take affect a #15 blade was used to incise the skin over the area.  The skin edges were reapproximated with 5-0 Monocryl.  A dressing was applied.  The patient was given instructions on how to care for the area and a follow up appointment.  Darlene Wright tolerated the procedure well and there were no complications. The specimen was sent to pathology and appeared to be a lipoma.

## 2022-04-11 ENCOUNTER — Telehealth: Payer: Self-pay | Admitting: *Deleted

## 2022-04-11 LAB — SURGICAL PATHOLOGY

## 2022-04-11 NOTE — Telephone Encounter (Signed)
Called pt to give path results per Dr. Marla Roe. No answer/left vm

## 2022-04-17 ENCOUNTER — Ambulatory Visit (INDEPENDENT_AMBULATORY_CARE_PROVIDER_SITE_OTHER): Payer: BLUE CROSS/BLUE SHIELD | Admitting: Student

## 2022-04-17 DIAGNOSIS — R2241 Localized swelling, mass and lump, right lower limb: Secondary | ICD-10-CM

## 2022-04-17 NOTE — Progress Notes (Signed)
Patient is a 31 year old female with history of right thigh/buttock mass.  Patient underwent excision of the mass on 04/09/2022 with Dr. Marla Roe.  Skin edges were reapproximated with 5-0 Monocryl.  Pathology shows mature adipose tissue and separate fragment of fibroadipose soft tissue with benign adnexal ducts most consistent with a lipoma/fibrolipoma and adjacent benign subcutaneous soft tissue.  Today patient reports she is doing well.  She states that she has had her husband take pictures of the site and feels that some of the sutures have come undone.  She reports there was a little bit of bloody drainage on the Band-Aid on Saturday and then reports there was some clear drainage the next day.  She denies seeing any redness in the photos her husband took.  She denies any purulent drainage.  She denies any fevers or chills.  She reports some minor swelling to the area.  I discussed the results of the pathology with the patient.  Patient acknowledged.  Chaperone present on exam.  On exam, patient is sitting upright in no acute distress.  There appears to be superficial wound at the incision site.  There are 2 Monocryl sutures noted to the area.  There is no surrounding erythema or cellulitic changes.  There is no purulent drainage noted.  There appears to be some skin irritation from the Band-Aid that the patient was using.  Discussed with patient that she can place Vaseline, gauze and Medipore tape daily over the incision site.  Discussed that patient may shower allowing soapy water to run over the incision site and to pat the area dry.  Discussed with patient that she should avoid strenuous activities and avoid submerging the incision.  Instructed patient to call if she has any questions or concerns.  Instructed her to call if the area becomes more swollen, red or starts draining purulent drainage.  Pictures were obtained with the patient and placed in the patient's chart with the patient's  permission.  Patient to follow-up next week.

## 2022-04-24 NOTE — Progress Notes (Signed)
Patient is a 31 year old female with a history of a right thigh/buttocks mass.  Patient underwent excision of the mass on 04/09/2022 with Dr. Marla Roe.  The skin edges were reapproximated with 5-0 Monocryl.  The pathology showed mature adipose tissue and separate fragment of fibroadipose soft tissue with benign adnexal ducts most consistent with a lipoma/fibrolipoma and adjacent benign subcu tenuous soft tissue.  Patient presents for postprocedural follow-up.  Patient was last seen in the clinic on 04/17/2022.  At this visit, she was doing well.  Results of the pathology were discussed with the patient.  She did have some concerns that the sutures had come undone.  She denied any fevers or chills.  She denies any redness or drainage in the area.  On exam, there is a small superficial wound at the incision site.  There was no erythema or purulent drainage.  Planned for patient to place Vaseline daily on the wound and to follow-up.  Today, patient reports she is doing well.  Patient states she cannot see the area well, but she has no new concerns at this point.  She denies any swelling that she notices or purulent drainage.  She denies fevers or chills.  On exam, patient is sitting upright in no acute distress.  The incision site appears to be irritated and a small superficial wound centrally.  There is no drainage noted.  There is no malodor noted.  Surrounding skin appears slightly irritated from bandages.  There are 2 Monocryl suture knots noted.  These were trimmed and removed.  Patient tolerated well.  Discussed with patient that she may place small square of Medihoney on the wound every other day.  Discussed she can put gauze and Medipore tape over to hold it in place.  Medihoney was given to patient.  Discussed with patient she should avoid any bending activities or strenuous activities for another week.    Patient to follow-up as needed.  Instructed patient to call if she has any questions or  concerns.  Picture was obtained with the patient and placed in the patient's chart with the patient's permission.  Objective findings and plan were discussed with the Dr. Marla Roe.

## 2022-04-25 ENCOUNTER — Telehealth (INDEPENDENT_AMBULATORY_CARE_PROVIDER_SITE_OTHER): Payer: BLUE CROSS/BLUE SHIELD | Admitting: Family

## 2022-04-25 DIAGNOSIS — T753XXA Motion sickness, initial encounter: Secondary | ICD-10-CM

## 2022-04-25 MED ORDER — SCOPOLAMINE 1 MG/3DAYS TD PT72: 1.0000 | MEDICATED_PATCH | TRANSDERMAL | 0 refills | Status: DC

## 2022-04-25 NOTE — Progress Notes (Signed)
Darlene Wright is a 31 y.o. female with the following history as recorded in Epic[employer]:  Patient Active Problem List   Diagnosis Date Noted   Mass of buttock 03/21/2022   Polycystic ovarian syndrome 07/21/2018   BREAST MASS, LEFT 05/08/2009    Current Outpatient Medications  Medication Sig Dispense Refill   albuterol (VENTOLIN HFA) 108 (90 Base) MCG/ACT inhaler Inhale 1-2 puffs into the lungs every 6 (six) hours as needed for wheezing or shortness of breath. 8 g 1   Biotin 1000 MCG tablet Taking 500 mcg     Dapsone 7.5 % GEL SMARTSIG:Sparingly Topical Every Morning     hydrOXYzine (ATARAX/VISTARIL) 10 MG tablet Take 10 mg by mouth 3 (three) times daily as needed.     levonorgestrel-ethinyl estradiol (SEASONALE) 0.15-0.03 MG tablet Take 1 tablet by mouth at bedtime.     lisdexamfetamine (VYVANSE) 50 MG capsule Take 50 mg by mouth daily.     ondansetron (ZOFRAN-ODT) 4 MG disintegrating tablet Take 1 tablet (4 mg total) by mouth every 8 (eight) hours as needed for nausea or vomiting. 20 tablet 0   spironolactone (ALDACTONE) 50 MG tablet Take 50 mg by mouth every morning.     tretinoin (RETIN-A) 0.05 % cream PLEASE SEE ATTACHED FOR DETAILED DIRECTIONS     No current facility-administered medications for this visit.    Allergies: Wound dressing adhesive  Past Medical History:  Diagnosis Date   Anxiety    Depression    Fibroadenoma of both breasts    GERD (gastroesophageal reflux disease)    Palpitations    PCOS (polycystic ovarian syndrome)     Past Surgical History:  Procedure Laterality Date   WISDOM TOOTH EXTRACTION     WISDOM TOOTH EXTRACTION      Family History  Problem Relation Age of Onset   Hypertension Mother    Alcohol abuse Mother    Hypertension Father    Alcohol abuse Father    Diabetes Maternal Grandmother    Hypertension Maternal Grandfather    Hypertension Paternal Grandmother    Leukemia Paternal Grandfather    Diabetes Paternal Grandfather    Hypertension  Paternal Grandfather    Polycystic ovary syndrome Sister    Post-traumatic stress disorder Sister    Bronchitis Sister    Pneumonia Sister    Colon cancer Neg Hx    Esophageal cancer Neg Hx    Rectal cancer Neg Hx    Stomach cancer Neg Hx     Social History   Tobacco Use   Smoking status: Never    Passive exposure: Yes   Smokeless tobacco: Never  Substance Use Topics   Alcohol use: Yes    Comment: social    Subjective:    I connected with NATALEAH SCIONEAUX on 04/25/22 at  2:20 PM EDT by a telephone call and verified that I am speaking with the correct person using two identifiers.   I discussed the limitations of evaluation and management by telemedicine and the availability of in person appointments. The patient expressed understanding and agreed to proceed. Provider in office/ patient is at home; provider and patient are only 2 people on telephone call.    Will be going on cruise later this month; is prone to sea-sickness; requesting prescription for patches;    Objective:  There were no vitals filed for this visit.  Lungs: Respirations unlabored;  Neurologic: Alert and oriented; speech intact;   Assessment:  1. Motion sickness, initial encounter     Plan:  Rx for Transderm Scop patches- use as directed; follow up as needed otherwise.   Time spent 8 minutes  No follow-ups on file.  No orders of the defined types were placed in this encounter.   Requested Prescriptions    No prescriptions requested or ordered in this encounter

## 2022-04-26 ENCOUNTER — Ambulatory Visit (INDEPENDENT_AMBULATORY_CARE_PROVIDER_SITE_OTHER): Payer: BLUE CROSS/BLUE SHIELD | Admitting: Student

## 2022-04-26 DIAGNOSIS — R2241 Localized swelling, mass and lump, right lower limb: Secondary | ICD-10-CM

## 2022-05-27 ENCOUNTER — Other Ambulatory Visit: Payer: Self-pay | Admitting: Family

## 2022-05-27 MED ORDER — LEVONORGEST-ETH ESTRAD 91-DAY 0.15-0.03 MG PO TABS: 1.0000 | ORAL_TABLET | Freq: Every day | ORAL | 0 refills | Status: DC

## 2022-06-11 ENCOUNTER — Encounter: Payer: Self-pay | Admitting: Family

## 2022-06-11 ENCOUNTER — Ambulatory Visit (INDEPENDENT_AMBULATORY_CARE_PROVIDER_SITE_OTHER)
Admission: RE | Admit: 2022-06-11 | Discharge: 2022-06-11 | Disposition: A | Discharge status: Home / Self Care | Payer: BLUE CROSS/BLUE SHIELD | Source: Ambulatory Visit | Attending: Family | Admitting: Family

## 2022-06-11 ENCOUNTER — Ambulatory Visit: Payer: BLUE CROSS/BLUE SHIELD | Admitting: Family

## 2022-06-11 VITALS — BP 100/50 | HR 84 | Temp 99.3°F | Ht 65.0 in | Wt 147.8 lb

## 2022-06-11 DIAGNOSIS — G90A Postural orthostatic tachycardia syndrome (POTS): Secondary | ICD-10-CM

## 2022-06-11 DIAGNOSIS — R0602 Shortness of breath: Secondary | ICD-10-CM | POA: Diagnosis not present

## 2022-06-11 DIAGNOSIS — M249 Joint derangement, unspecified: Secondary | ICD-10-CM

## 2022-06-11 NOTE — Progress Notes (Signed)
Darlene Wright is a 31 y.o. female with the following history as recorded in Epic[employer]:  Patient Active Problem List   Diagnosis Date Noted   Mass of buttock 03/21/2022   Polycystic ovarian syndrome 07/21/2018   BREAST MASS, LEFT 05/08/2009    Current Outpatient Medications  Medication Sig Dispense Refill   albuterol (VENTOLIN HFA) 108 (90 Base) MCG/ACT inhaler Inhale 1-2 puffs into the lungs every 6 (six) hours as needed for wheezing or shortness of breath. 8 g 1   Biotin 1000 MCG tablet Taking 500 mcg     hydrOXYzine (ATARAX/VISTARIL) 10 MG tablet Take 10 mg by mouth 3 (three) times daily as needed.     levonorgestrel-ethinyl estradiol (SEASONALE) 0.15-0.03 MG tablet Take 1 tablet by mouth at bedtime. 91 tablet 0   lisdexamfetamine (VYVANSE) 50 MG capsule Take 50 mg by mouth daily.     spironolactone (ALDACTONE) 50 MG tablet Take 50 mg by mouth every morning.     tretinoin (RETIN-A) 0.05 % cream PLEASE SEE ATTACHED FOR DETAILED DIRECTIONS     No current facility-administered medications for this visit.    Allergies: Wound dressing adhesive  Past Medical History:  Diagnosis Date   Anxiety    Depression    Fibroadenoma of both breasts    GERD (gastroesophageal reflux disease)    Palpitations    PCOS (polycystic ovarian syndrome)     Past Surgical History:  Procedure Laterality Date   WISDOM TOOTH EXTRACTION     WISDOM TOOTH EXTRACTION      Family History  Problem Relation Age of Onset   Hypertension Mother    Alcohol abuse Mother    Hypertension Father    Alcohol abuse Father    Diabetes Maternal Grandmother    Hypertension Maternal Grandfather    Hypertension Paternal Grandmother    Leukemia Paternal Grandfather    Diabetes Paternal Grandfather    Hypertension Paternal Grandfather    Polycystic ovary syndrome Sister    Post-traumatic stress disorder Sister    Bronchitis Sister    Pneumonia Sister    Colon cancer Neg Hx    Esophageal cancer Neg Hx    Rectal  cancer Neg Hx    Stomach cancer Neg Hx     Social History   Tobacco Use   Smoking status: Never    Passive exposure: Yes   Smokeless tobacco: Never  Substance Use Topics   Alcohol use: Yes    Comment: social    Subjective:   Has been doing research about continued concerns for her health- is concerned that she has POTS and is also wondering if she has Drue Dun; does feel that she has some hypermobility;      Objective:  Vitals:   06/11/22 1523  BP: (!) 100/50  Pulse: 84  Temp: 99.3 F (37.4 C)  TempSrc: Oral  SpO2: 95%  Weight: 147 lb 12.8 oz (67 kg)  Height: '5\' 5"'$  (1.651 m)    General: Well developed, well nourished, in no acute distress  Skin : Warm and dry.  Head: Normocephalic and atraumatic  Lungs: Respirations unlabored; clear to auscultation bilaterally without wheeze, rales, rhonchi  CVS exam: normal rate and regular rhythm.  Neurologic: Alert and oriented; speech intact; face symmetrical; moves all extremities well; CNII-XII intact without focal deficit   Assessment:  1. POTS (postural orthostatic tachycardia syndrome)   2. Hypermobility of joint   3. Shortness of breath     Plan:  Subjective concerns for [facility]/ POTS;  will set up referrals as requested; discussed possibility of dysautonomia; Will update d-dimer and CXR today- physical exam is reassuring;   No follow-ups on file.  Orders Placed This Encounter  Procedures   DG Chest 2 View    Standing Status:   Future    Number of Occurrences:   1    Standing Expiration Date:   06/12/2023    Order Specific Question:   Reason for Exam (SYMPTOM  OR DIAGNOSIS REQUIRED)    Answer:   shortness of breath    Order Specific Question:   Is patient pregnant?    Answer:   No    Order Specific Question:   Preferred imaging location?    Answer:   [facility]   [facility], Quantitative   Ambulatory referral to Cardiology    Referral Priority:   Routine    Referral Type:   Consultation     Referral Reason:   Specialty Services Required    Requested Specialty:   Cardiology    Number of Visits Requested:   1   Ambulatory referral to Neurology    Referral Priority:   Routine    Referral Type:   Consultation    Referral Reason:   Specialty Services Required    Requested Specialty:   Neurology    Number of Visits Requested:   1    Requested Prescriptions    No prescriptions requested or ordered in this encounter

## 2022-06-12 LAB — D-DIMER, QUANTITATIVE: D-Dimer, Quant: 0.28 mcg/mL FEU (ref <0.50)

## 2022-06-21 ENCOUNTER — Encounter: Payer: Self-pay | Admitting: Family

## 2022-06-21 ENCOUNTER — Other Ambulatory Visit: Payer: Self-pay | Admitting: Family

## 2022-06-21 DIAGNOSIS — Q796 Ehlers-Danlos syndrome, unspecified: Secondary | ICD-10-CM

## 2022-06-28 ENCOUNTER — Other Ambulatory Visit: Payer: Self-pay | Admitting: Family

## 2022-06-28 DIAGNOSIS — M249 Joint derangement, unspecified: Secondary | ICD-10-CM

## 2022-07-09 ENCOUNTER — Encounter: Payer: Self-pay | Admitting: Family Medicine

## 2022-07-16 ENCOUNTER — Encounter: Payer: Self-pay | Admitting: Interventional Cardiology

## 2022-08-25 ENCOUNTER — Other Ambulatory Visit: Payer: Self-pay | Admitting: Family

## 2022-09-16 ENCOUNTER — Other Ambulatory Visit (HOSPITAL_BASED_OUTPATIENT_CLINIC_OR_DEPARTMENT_OTHER): Payer: Self-pay

## 2022-09-16 MED ORDER — LISDEXAMFETAMINE DIMESYLATE 60 MG PO CAPS
60.0000 mg | ORAL_CAPSULE | Freq: Every day | ORAL | 0 refills | Status: DC
  Filled 2022-09-16: qty 30, 30d supply, fill #0

## 2022-09-17 ENCOUNTER — Ambulatory Visit (INDEPENDENT_AMBULATORY_CARE_PROVIDER_SITE_OTHER): Payer: BLUE CROSS/BLUE SHIELD | Admitting: Family

## 2022-09-17 ENCOUNTER — Other Ambulatory Visit (HOSPITAL_BASED_OUTPATIENT_CLINIC_OR_DEPARTMENT_OTHER): Payer: Self-pay

## 2022-09-17 ENCOUNTER — Encounter: Payer: Self-pay | Admitting: Family

## 2022-09-17 VITALS — BP 92/68 | Temp 98.2°F

## 2022-09-17 DIAGNOSIS — Z0000 Encounter for general adult medical examination without abnormal findings: Secondary | ICD-10-CM | POA: Diagnosis not present

## 2022-09-17 NOTE — Progress Notes (Signed)
Darlene Wright is a 31 y.o. female with the following history as recorded in EpicCare:  Patient Active Problem List   Diagnosis Date Noted   Mass of buttock 03/21/2022   Polycystic ovarian syndrome 07/21/2018   BREAST MASS, LEFT 05/08/2009    Current Outpatient Medications  Medication Sig Dispense Refill   Biotin 1000 MCG tablet Taking 500 mcg     Cholecalciferol (VITAMIN D3) 10 MCG (400 UNIT) CAPS Take by mouth.     cyanocobalamin (VITAMIN B12) 1000 MCG tablet Take 1,000 mcg by mouth daily.     hydrOXYzine (ATARAX/VISTARIL) 10 MG tablet Take 10 mg by mouth 3 (three) times daily as needed.     levonorgestrel-ethinyl estradiol (SEASONALE) 0.15-0.03 MG tablet TAKE 1 TABLET BY MOUTH EVERYDAY AT BEDTIME 91 tablet 0   lisdexamfetamine (VYVANSE) 60 MG capsule Take 1 capsule (60 mg total) by mouth daily. 30 capsule 0   Magnesium Chloride (MAGNESIUM DR PO) Take by mouth.     Multiple Vitamin (MULTIVITAMIN WITH MINERALS) TABS tablet Take 1 tablet by mouth daily.     Omega-3 Fatty Acids (FISH OIL) 1000 MG CAPS Take by mouth.     spironolactone (ALDACTONE) 50 MG tablet Take 50 mg by mouth every morning.     tretinoin (RETIN-A) 0.05 % cream PLEASE SEE ATTACHED FOR DETAILED DIRECTIONS     albuterol (VENTOLIN HFA) 108 (90 Base) MCG/ACT inhaler Inhale 1-2 puffs into the lungs every 6 (six) hours as needed for wheezing or shortness of breath. 8 g 1   No current facility-administered medications for this visit.    Allergies: Wound dressing adhesive  Past Medical History:  Diagnosis Date   Anxiety    Depression    Fibroadenoma of both breasts    GERD (gastroesophageal reflux disease)    Palpitations    PCOS (polycystic ovarian syndrome)     Past Surgical History:  Procedure Laterality Date   WISDOM TOOTH EXTRACTION     WISDOM TOOTH EXTRACTION      Family History  Problem Relation Age of Onset   Hypertension Mother    Alcohol abuse Mother    Hypertension Father    Alcohol abuse Father     Diabetes Maternal Grandmother    Hypertension Maternal Grandfather    Hypertension Paternal Grandmother    Leukemia Paternal Grandfather    Diabetes Paternal Grandfather    Hypertension Paternal Grandfather    Polycystic ovary syndrome Sister    Post-traumatic stress disorder Sister    Bronchitis Sister    Pneumonia Sister    Colon cancer Neg Hx    Esophageal cancer Neg Hx    Rectal cancer Neg Hx    Stomach cancer Neg Hx     Social History   Tobacco Use   Smoking status: Never    Passive exposure: Yes   Smokeless tobacco: Never  Substance Use Topics   Alcohol use: Yes    Comment: social    Subjective:  Presents for yearly CPE; is working with GYN and scheduled to see them next week; up to date on dental and eye exams; no acute concerns today;   Review of Systems  Constitutional: Negative.   HENT: Negative.    Eyes: Negative.   Respiratory: Negative.    Cardiovascular: Negative.   Gastrointestinal: Negative.   Genitourinary: Negative.   Musculoskeletal: Negative.   Skin: Negative.   Neurological: Negative.   Endo/Heme/Allergies: Negative.   Psychiatric/Behavioral: Negative.       Objective:  Vitals:   09/17/22  1654  BP: 92/68  Temp: 98.2 F (36.8 C)    General: Well developed, well nourished, in no acute distress  Skin : Warm and dry.  Head: Normocephalic and atraumatic  Eyes: Sclera and conjunctiva clear; pupils round and reactive to light; extraocular movements intact  Ears: External normal; canals clear; tympanic membranes normal  Oropharynx: Pink, supple. No suspicious lesions  Neck: Supple without thyromegaly, adenopathy  Lungs: Respirations unlabored; clear to auscultation bilaterally without wheeze, rales, rhonchi  CVS exam: normal rate and regular rhythm.  Abdomen: Soft; nontender; nondistended; normoactive bowel sounds; no masses or hepatosplenomegaly  Musculoskeletal: No deformities; no active joint inflammation  Extremities: No edema, cyanosis,  clubbing  Vessels: Symmetric bilaterally  Neurologic: Alert and oriented; speech intact; face symmetrical; moves all extremities well; CNII-XII intact without focal deficit   Assessment:  1. PE (physical exam), annual     Plan:  Age appropriate preventive healthcare needs addressed; encouraged regular eye doctor and dental exams; encouraged regular exercise; will update labs and refills as needed today; follow-up to be determined;    No follow-ups on file.  No orders of the defined types were placed in this encounter.   Requested Prescriptions    No prescriptions requested or ordered in this encounter

## 2022-09-25 ENCOUNTER — Ambulatory Visit (INDEPENDENT_AMBULATORY_CARE_PROVIDER_SITE_OTHER): Payer: BLUE CROSS/BLUE SHIELD | Admitting: Obstetrics and Gynecology

## 2022-09-25 ENCOUNTER — Other Ambulatory Visit (HOSPITAL_COMMUNITY)
Admission: RE | Admit: 2022-09-25 | Discharge: 2022-09-25 | Disposition: A | Discharge status: Home / Self Care | Payer: BLUE CROSS/BLUE SHIELD | Source: Ambulatory Visit | Attending: Obstetrics and Gynecology | Admitting: Obstetrics and Gynecology

## 2022-09-25 ENCOUNTER — Encounter: Payer: Self-pay | Admitting: Obstetrics and Gynecology

## 2022-09-25 VITALS — BP 118/64 | HR 101

## 2022-09-25 DIAGNOSIS — Z124 Encounter for screening for malignant neoplasm of cervix: Secondary | ICD-10-CM | POA: Insufficient documentation

## 2022-09-25 DIAGNOSIS — R6882 Decreased libido: Secondary | ICD-10-CM | POA: Diagnosis not present

## 2022-09-25 NOTE — Progress Notes (Signed)
ANNUAL EXAM Patient name: Darlene Wright MRN [id number]  Date of birth: 19-Jan-1991 Chief Complaint:   Gynecologic Exam  History of Present Illness:   Darlene Wright is a 31 y.o. G0P0000 being seen today for a routine annual exam.  Current complaints: low libido  Reports years or low libido. Able to enjoy sexual intercourse. On current OCP at least 1 year. Had noticed increase desire with reading romance novels but that desire did not last very long. Just wondering if there is anything that can be done for it.   No LMP recorded. (Menstrual status: Oral contraceptives).   The pregnancy intention screening data noted above was reviewed. Potential methods of contraception were discussed. The patient elected to proceed with No data recorded.   Last pap 04/2015. Results were: NILM w/ HRHPV not done. H/O abnormal pap: no Last mammogram: n/a.  Last colonoscopy: n/a.      06/11/2022    3:26 PM 09/18/2021    3:17 PM 03/13/2021   11:30 AM 08/09/2020    8:46 AM  Depression screen PHQ 2/9  Decreased Interest 0 1 0 0  Down, Depressed, Hopeless 0 1 0 2  PHQ - 2 Score 0 2 0 2  Altered sleeping  3  0  Tired, decreased energy  0  0  Change in appetite  0  0  Feeling bad or failure about yourself   0  0  Trouble concentrating  2  0  Moving slowly or fidgety/restless  0  0  Suicidal thoughts  0  0  PHQ-9 Score  7  2  Difficult doing work/chores  Somewhat difficult  Somewhat difficult         No data to display           Review of Systems:   Pertinent items are noted in HPI Denies any headaches, blurred vision, fatigue, shortness of breath, chest pain, abdominal pain, abnormal vaginal discharge/itching/odor/irritation, problems with periods, bowel movements, urination, or intercourse unless otherwise stated above. Pertinent History Reviewed:  Reviewed past medical,surgical, social and family history.  Reviewed problem list, medications and allergies. Physical Assessment:   Vitals:    09/25/22 1102  BP: 118/64  Pulse: (!) 101  There is no height or weight on file to calculate BMI.        Physical Examination:   General appearance - well appearing, and in no distress  Mental status - alert, oriented to person, place, and time  Psych:  She has a normal mood and affect  Skin - warm and dry, normal color, no suspicious lesions noted  Chest - effort normal, all lung [place] clear to auscultation bilaterally  Heart - normal rate and regular rhythm  Breasts - breasts appear normal, no suspicious masses, no skin or nipple changes or  axillary nodes  Abdomen - soft, nontender, nondistended, no masses or organomegaly  Pelvic -  VULVA: normal appearing vulva with no masses, tenderness or lesions   VAGINA: normal appearing vagina with normal color and discharge, no lesions   CERVIX: normal appearing cervix without discharge or lesions, no CMT  Thin prep pap is done without HR HPV cotesting  UTERUS: uterus is felt to be normal size, shape, consistency and nontender   ADNEXA: No adnexal masses or tenderness noted.  Extremities:  No swelling or varicosities noted  Chaperone present for exam  No results found for this or any previous visit (from the past 24 hour(s)).    Assessment & Plan:  1. Screening for malignant neoplasm of cervix Routine pap collected today.  - Cytology - PAP( [facility])  2. Low libido Discussed limited evidence-based treatments for low libido. Can consider herbal supplements such as maca root or ashwaganda.    No orders of the defined types were placed in this encounter.   Meds: No orders of the defined types were placed in this encounter.   Follow-up: No follow-ups on file.  Darliss Cheney, MD 09/25/2022 11:32 AM

## 2022-09-30 LAB — CYTOLOGY - PAP
Adequacy: ABSENT
Comment: NEGATIVE
Diagnosis: NEGATIVE
High risk HPV: NEGATIVE

## 2022-11-11 ENCOUNTER — Other Ambulatory Visit: Payer: Self-pay

## 2022-11-11 MED ORDER — LISDEXAMFETAMINE DIMESYLATE 60 MG PO CAPS
60.0000 mg | ORAL_CAPSULE | Freq: Every day | ORAL | 0 refills | Status: DC
  Filled 2022-11-11: qty 30, 30d supply, fill #0

## 2022-11-13 ENCOUNTER — Other Ambulatory Visit: Payer: Self-pay

## 2022-11-17 ENCOUNTER — Other Ambulatory Visit: Payer: Self-pay | Admitting: Family

## 2022-12-06 ENCOUNTER — Other Ambulatory Visit: Payer: Self-pay

## 2022-12-06 MED ORDER — LISDEXAMFETAMINE DIMESYLATE 60 MG PO CAPS
60.0000 mg | ORAL_CAPSULE | Freq: Every day | ORAL | 0 refills | Status: DC
  Filled 2022-12-06 – 2022-12-09 (×2): qty 30, 30d supply, fill #0

## 2022-12-09 ENCOUNTER — Other Ambulatory Visit: Payer: Self-pay

## 2023-01-15 ENCOUNTER — Other Ambulatory Visit: Payer: Self-pay

## 2023-01-15 MED ORDER — LISDEXAMFETAMINE DIMESYLATE 60 MG PO CAPS
60.0000 mg | ORAL_CAPSULE | Freq: Every day | ORAL | 0 refills | Status: DC
  Filled 2023-01-15: qty 30, 30d supply, fill #0

## 2023-01-20 ENCOUNTER — Other Ambulatory Visit: Payer: Self-pay

## 2023-02-21 ENCOUNTER — Other Ambulatory Visit: Payer: Self-pay

## 2023-02-21 MED ORDER — LISDEXAMFETAMINE DIMESYLATE 60 MG PO CAPS
60.0000 mg | ORAL_CAPSULE | Freq: Every day | ORAL | 0 refills | Status: DC
  Filled 2023-02-21: qty 30, 30d supply, fill #0

## 2023-02-28 ENCOUNTER — Other Ambulatory Visit: Payer: Self-pay | Admitting: Family

## 2023-02-28 MED ORDER — LEVONORGEST-ETH ESTRAD 91-DAY 0.15-0.03 MG PO TABS: 1.0000 | ORAL_TABLET | Freq: Every day | ORAL | 1 refills | Status: DC

## 2023-03-29 ENCOUNTER — Ambulatory Visit
Admission: EM | Admit: 2023-03-29 | Discharge: 2023-03-29 | Disposition: A | Discharge status: Home / Self Care | Payer: BC Managed Care – PPO | Attending: Urgent Care | Admitting: Urgent Care

## 2023-03-29 DIAGNOSIS — A084 Viral intestinal infection, unspecified: Secondary | ICD-10-CM

## 2023-03-29 DIAGNOSIS — K219 Gastro-esophageal reflux disease without esophagitis: Secondary | ICD-10-CM

## 2023-03-29 LAB — POCT URINALYSIS DIP (MANUAL ENTRY)
Bilirubin, UA: NEGATIVE
Blood, UA: NEGATIVE
Glucose, UA: NEGATIVE mg/dL
Leukocytes, UA: NEGATIVE
Nitrite, UA: NEGATIVE
Protein Ur, POC: 30 mg/dL — AB
Spec Grav, UA: 1.025 (ref 1.010–1.025)
Urobilinogen, UA: 1 E.U./dL
pH, UA: 6 (ref 5.0–8.0)

## 2023-03-29 LAB — POCT URINE PREGNANCY: Preg Test, Ur: NEGATIVE

## 2023-03-29 MED ORDER — MAALOX MAX 400-400-40 MG/5ML PO SUSP: 5.0000 mL | Freq: Four times a day (QID) | ORAL | 0 refills | Status: DC | PRN

## 2023-03-29 MED ORDER — ONDANSETRON 8 MG PO TBDP: 8.0000 mg | ORAL_TABLET | Freq: Three times a day (TID) | ORAL | 0 refills | Status: DC | PRN

## 2023-03-29 MED ORDER — FAMOTIDINE 20 MG PO TABS: 20.0000 mg | ORAL_TABLET | Freq: Two times a day (BID) | ORAL | 0 refills | Status: DC

## 2023-03-29 NOTE — ED Triage Notes (Signed)
Pt reports vomiting, nausea, abdominal bloating, pressure x 2 days. Pressure is worse after eating [employer] last night. Pt reports she has a stomach bug. Lat bowel movement 2 days ago. Zofran and acetaminophen gives relief with vomiting.

## 2023-03-29 NOTE — ED Provider Notes (Signed)
[facility]  Note:  This document was prepared using Conservation officer, historic buildings and may include unintentional dictation errors.  MRN: [id number] DOB: 1991-02-24  Subjective:   Darlene Wright is a 32 y.o. female presenting for 2-3 day history of persistent nausea, vomiting, abdominal bloating, epigastric pain and pressure.  Patient felt like she had a GI bug initially.  She was responding to Tylenol and Zofran but unfortunately she ate Dione Plover last night and exacerbated her symptoms again.  She does have a history of GERD and feels like that is worse right now from her illness.  Not diarrhea.  No bloody stools.  No fever, recent antibiotic use, hospitalizations or long distance travel.  Has not eaten raw foods, drank unfiltered water.  No history of GI disorders including Crohn's, IBS, ulcerative colitis.  Has an occasional alcohol drink.  Does not use a lot of NSAIDs.  No current facility-administered medications for this encounter.  Current Outpatient Medications:    albuterol (VENTOLIN HFA) 108 (90 Base) MCG/ACT inhaler, Inhale 1-2 puffs into the lungs every 6 (six) hours as needed for wheezing or shortness of breath., Disp: 8 g, Rfl: 1   Biotin 1000 MCG tablet, Taking 500 mcg, Disp: , Rfl:    Cholecalciferol (VITAMIN D3) 10 MCG (400 UNIT) CAPS, Take by mouth., Disp: , Rfl:    cyanocobalamin (VITAMIN B12) 1000 MCG tablet, Take 1,000 mcg by mouth daily., Disp: , Rfl:    hydrOXYzine (ATARAX/VISTARIL) 10 MG tablet, Take 10 mg by mouth 3 (three) times daily as needed., Disp: , Rfl:    levonorgestrel-ethinyl estradiol (SEASONALE) 0.15-0.03 MG tablet, Take 1 tablet by mouth daily., Disp: 91 tablet, Rfl: 1   lisdexamfetamine (VYVANSE) 60 MG capsule, Take 1 capsule (60 mg total) by mouth daily., Disp: 30 capsule, Rfl: 0   Magnesium Chloride (MAGNESIUM DR PO), Take by mouth., Disp: , Rfl:    Multiple Vitamin (MULTIVITAMIN WITH MINERALS) TABS tablet, Take 1 tablet by  mouth daily., Disp: , Rfl:    Omega-3 Fatty Acids (FISH OIL) 1000 MG CAPS, Take by mouth., Disp: , Rfl:    spironolactone (ALDACTONE) 50 MG tablet, Take 50 mg by mouth every morning., Disp: , Rfl:    tretinoin (RETIN-A) 0.05 % cream, PLEASE SEE ATTACHED FOR DETAILED DIRECTIONS, Disp: , Rfl:    Allergies  Allergen Reactions   Wound Dressing Adhesive Dermatitis    Found when she had to use a Holter monitor. Unable to wear due to blisters     Past Medical History:  Diagnosis Date   Anxiety    Depression    Fibroadenoma of both breasts    GERD (gastroesophageal reflux disease)    Palpitations    PCOS (polycystic ovarian syndrome)      Past Surgical History:  Procedure Laterality Date   LIPOMA EXCISION Right 2023   WISDOM TOOTH EXTRACTION     WISDOM TOOTH EXTRACTION      Family History  Problem Relation Age of Onset   Hypertension Mother    Alcohol abuse Mother    Hypertension Father    Alcohol abuse Father    Diabetes Maternal Grandmother    Hypertension Maternal Grandfather    Hypertension Paternal Grandmother    Leukemia Paternal Grandfather    Diabetes Paternal Grandfather    Hypertension Paternal Grandfather    Polycystic ovary syndrome Sister    Post-traumatic stress disorder Sister    Bronchitis Sister    Pneumonia Sister    Colon  cancer Neg Hx    Esophageal cancer Neg Hx    Rectal cancer Neg Hx    Stomach cancer Neg Hx     Social History   Tobacco Use   Smoking status: Never    Passive exposure: Yes   Smokeless tobacco: Never  Vaping Use   Vaping Use: Never used  Substance Use Topics   Alcohol use: Yes    Comment: social   Drug use: No    ROS   Objective:   Vitals: BP 106/66 (BP Location: Right Arm)   Pulse (!) 53   Temp 98.6 F (37 C) (Oral)   Resp 18   SpO2 97%   Physical Exam Constitutional:      General: She is not in acute distress.    Appearance: Normal appearance. She is well-developed. She is not ill-appearing, toxic-appearing  or diaphoretic.  HENT:     Head: Normocephalic and atraumatic.     Nose: Nose normal.     Mouth/Throat:     Mouth: Mucous membranes are moist.     Pharynx: Oropharynx is clear.  Eyes:     General: No scleral icterus.       Right eye: No discharge.        Left eye: No discharge.     Extraocular Movements: Extraocular movements intact.     Conjunctiva/sclera: Conjunctivae normal.  Cardiovascular:     Rate and Rhythm: Normal rate.  Pulmonary:     Effort: Pulmonary effort is normal.  Abdominal:     General: Bowel sounds are increased. There is no distension.     Palpations: Abdomen is soft. There is no mass.     Tenderness: There is generalized abdominal tenderness and tenderness in the epigastric area and periumbilical area. There is no right CVA tenderness, left CVA tenderness, guarding or rebound.  Skin:    General: Skin is warm and dry.  Neurological:     General: No focal deficit present.     Mental Status: She is alert and oriented to person, place, and time.  Psychiatric:        Mood and Affect: Mood normal.        Behavior: Behavior normal.        Thought Content: Thought content normal.        Judgment: Judgment normal.     Results for orders placed or performed during the hospital encounter of 03/29/23 (from the past 24 hour(s))  POCT urinalysis dipstick     Status: Abnormal   Collection Time: 03/29/23  8:54 AM  Result Value Ref Range   Color, UA yellow yellow   Clarity, UA clear clear   Glucose, UA negative negative mg/dL   Bilirubin, UA negative negative   Ketones, POC UA trace (5) (A) negative mg/dL   Spec Grav, UA 1.914 7.829 - 1.025   Blood, UA negative negative   pH, UA 6.0 5.0 - 8.0   Protein Ur, POC =30 (A) negative mg/dL   Urobilinogen, UA 1.0 0.2 or 1.0 E.U./dL   Nitrite, UA Negative Negative   Leukocytes, UA Negative Negative  POCT urine pregnancy     Status: None   Collection Time: 03/29/23  8:54 AM  Result Value Ref Range   Preg Test, Ur  Negative Negative    Assessment and Plan :   PDMP not reviewed this encounter.  1. Viral gastroenteritis   2. Gastroesophageal reflux disease, unspecified whether esophagitis present    Will manage for suspected viral gastroenteritis with  supportive care.  Recommended patient hydrate well, eat light meals and maintain electrolytes.  Will use Zofran and Imodium for nausea, vomiting and diarrhea.  She also has acute on chronic exacerbation of her GERD.  Recommended trialing famotidine for this and using Maalox.  Patient used to take Prilosec but will hold off on restarting her with this. Counseled patient on potential for adverse effects with medications prescribed/recommended today, ER and return-to-clinic precautions discussed, patient verbalized understanding.    Wallis Bamberg, [place] 03/29/23 [phone]

## 2023-04-08 ENCOUNTER — Other Ambulatory Visit: Payer: Self-pay

## 2023-04-08 MED ORDER — LISDEXAMFETAMINE DIMESYLATE 60 MG PO CAPS
60.0000 mg | ORAL_CAPSULE | Freq: Every day | ORAL | 0 refills | Status: DC
  Filled 2023-04-08 – 2023-04-21 (×2): qty 30, 30d supply, fill #0

## 2023-04-15 ENCOUNTER — Other Ambulatory Visit: Payer: Self-pay

## 2023-04-21 ENCOUNTER — Other Ambulatory Visit: Payer: Self-pay

## 2023-04-22 ENCOUNTER — Other Ambulatory Visit: Payer: Self-pay

## 2023-05-09 ENCOUNTER — Ambulatory Visit (INDEPENDENT_AMBULATORY_CARE_PROVIDER_SITE_OTHER): Payer: Managed Care, Other (non HMO) | Admitting: Family

## 2023-05-09 ENCOUNTER — Encounter: Payer: Self-pay | Admitting: Family

## 2023-05-09 VITALS — BP 104/68 | HR 70 | Ht 65.0 in | Wt 144.8 lb

## 2023-05-09 DIAGNOSIS — D241 Benign neoplasm of right breast: Secondary | ICD-10-CM

## 2023-05-09 DIAGNOSIS — R000 Tachycardia, unspecified: Secondary | ICD-10-CM

## 2023-05-09 DIAGNOSIS — Z13220 Encounter for screening for lipoid disorders: Secondary | ICD-10-CM | POA: Diagnosis not present

## 2023-05-09 DIAGNOSIS — Z0000 Encounter for general adult medical examination without abnormal findings: Secondary | ICD-10-CM | POA: Diagnosis not present

## 2023-05-09 LAB — CBC WITH DIFFERENTIAL/PLATELET
Absolute Monocytes: 689 cells/uL (ref 200–950)
Basophils Absolute: 78 cells/uL (ref 0–200)
Eosinophils Relative: 2.1 %
HCT: 43.1 % (ref 35.0–45.0)
Hemoglobin: 14.4 g/dL (ref 11.7–15.5)
MCHC: 33.4 g/dL (ref 32.0–36.0)
MCV: 89.6 fL (ref 80.0–100.0)
MPV: 11.3 fL (ref 7.5–12.5)
Neutrophils Relative %: 53.6 %
RDW: 11.8 % (ref 11.0–15.0)
Total Lymphocyte: 33.5 %

## 2023-05-09 NOTE — Progress Notes (Signed)
Darlene Wright is a 32 y.o. female with the following history as recorded in EpicCare:  Patient Active Problem List   Diagnosis Date Noted   Mass of buttock 03/21/2022   Polycystic ovarian syndrome 07/21/2018   BREAST MASS, LEFT 05/08/2009    Current Outpatient Medications  Medication Sig Dispense Refill   albuterol (VENTOLIN HFA) 108 (90 Base) MCG/ACT inhaler Inhale 1-2 puffs into the lungs every 6 (six) hours as needed for wheezing or shortness of breath. 8 g 1   Biotin 1000 MCG tablet Taking 500 mcg     Cholecalciferol (VITAMIN D3) 10 MCG (400 UNIT) CAPS Take by mouth.     cyanocobalamin (VITAMIN B12) 1000 MCG tablet Take 1,000 mcg by mouth daily.     hydrOXYzine (ATARAX/VISTARIL) 10 MG tablet Take 10 mg by mouth 3 (three) times daily as needed.     levonorgestrel-ethinyl estradiol (SEASONALE) 0.15-0.03 MG tablet Take 1 tablet by mouth daily. 91 tablet 1   lisdexamfetamine (VYVANSE) 60 MG capsule Take 1 capsule (60 mg total) by mouth daily. 30 capsule 0   Magnesium Chloride (MAGNESIUM DR PO) Take by mouth.     Multiple Vitamin (MULTIVITAMIN WITH MINERALS) TABS tablet Take 1 tablet by mouth daily.     Omega-3 Fatty Acids (FISH OIL) 1000 MG CAPS Take by mouth.     ondansetron (ZOFRAN-ODT) 8 MG disintegrating tablet Take 1 tablet (8 mg total) by mouth every 8 (eight) hours as needed for nausea or vomiting. 20 tablet 0   spironolactone (ALDACTONE) 50 MG tablet Take 50 mg by mouth every morning.     tretinoin (RETIN-A) 0.05 % cream PLEASE SEE ATTACHED FOR DETAILED DIRECTIONS     alum & mag hydroxide-simeth (MAALOX MAX) 400-400-40 MG/5ML suspension Take 5 mLs by mouth every 6 (six) hours as needed for indigestion. 100 mL 0   famotidine (PEPCID) 20 MG tablet Take 1 tablet (20 mg total) by mouth 2 (two) times daily. 30 tablet 0   No current facility-administered medications for this visit.    Allergies: Wound dressing adhesive  Past Medical History:  Diagnosis Date   Anxiety     Depression    Fibroadenoma of both breasts    GERD (gastroesophageal reflux disease)    Palpitations    PCOS (polycystic ovarian syndrome)     Past Surgical History:  Procedure Laterality Date   LIPOMA EXCISION Right 2023   WISDOM TOOTH EXTRACTION     WISDOM TOOTH EXTRACTION      Family History  Problem Relation Age of Onset   Hypertension Mother    Alcohol abuse Mother    Hypertension Father    Alcohol abuse Father    Diabetes Maternal Grandmother    Hypertension Maternal Grandfather    Hypertension Paternal Grandmother    Leukemia Paternal Grandfather    Diabetes Paternal Grandfather    Hypertension Paternal Grandfather    Polycystic ovary syndrome Sister    Post-traumatic stress disorder Sister    Bronchitis Sister    Pneumonia Sister    Colon cancer Neg Hx    Esophageal cancer Neg Hx    Rectal cancer Neg Hx    Stomach cancer Neg Hx     Social History   Tobacco Use   Smoking status: Never    Passive exposure: Yes   Smokeless tobacco: Never  Substance Use Topics   Alcohol use: Yes    Comment: social    Subjective:   Presents for yearly CPE; actually had CPE in November 2023  but insurance has changed and wants to go and update for this year early; continuing to work GYN and psychiatrist;  Does have history of fibroadenomas-  concerned that has fibroadenoma in her right breast;  Would like to get updated referral to cardiology- history of tachycardia/ subjective concern for POTS;   Review of Systems  Constitutional: Negative.   HENT: Negative.    Eyes: Negative.   Respiratory: Negative.    Cardiovascular: Negative.   Gastrointestinal: Negative.   Genitourinary: Negative.   Musculoskeletal: Negative.   Skin: Negative.   Neurological: Negative.   Endo/Heme/Allergies: Negative.   Psychiatric/Behavioral: Negative.       Objective:  Vitals:   05/09/23 1042  BP: 104/68  Pulse: 70  SpO2: 97%  Weight: 144 lb 12.8 oz (65.7 kg)  Height: 5\' 5"  (1.651 m)     General: Well developed, well nourished, in no acute distress  Skin : Warm and dry.  Head: Normocephalic and atraumatic  Eyes: Sclera and conjunctiva clear; pupils round and reactive to light; extraocular movements intact  Ears: External normal; canals clear; tympanic membranes normal  Oropharynx: Pink, supple. No suspicious lesions  Neck: Supple without thyromegaly, adenopathy  Lungs: Respirations unlabored; clear to auscultation bilaterally without wheeze, rales, rhonchi  CVS exam: normal rate and regular rhythm.  Abdomen: Soft; nontender; nondistended; normoactive bowel sounds; no masses or hepatosplenomegaly  Musculoskeletal: No deformities; no active joint inflammation  Extremities: No edema, cyanosis, clubbing  Vessels: Symmetric bilaterally  Neurologic: Alert and oriented; speech intact; face symmetrical; moves all extremities well; CNII-XII intact without focal deficit   Assessment:  1. PE (physical exam), annual   2. Fibroadenoma of right breast   3. Tachycardia   4. Lipid screening     Plan:  Age appropriate preventive healthcare needs addressed; encouraged regular eye doctor and dental exams; encouraged regular exercise; will update labs and refills as needed today; follow-up to be determined; Update referral for breast ultrasounds; update referral to cardiology;   No follow-ups on file.  Orders Placed This Encounter  Procedures   US BREAST COMPLETE UNI LEFT INC AXILLA    Standing Status:   Future    Standing Expiration Date:   05/08/2024    Order Specific Question:   Reason for Exam (SYMPTOM  OR DIAGNOSIS REQUIRED)    Answer:   history of breast adenoma    Order Specific Question:   Preferred imaging location?    [facility]   US BREAST COMPLETE UNI RIGHT INC AXILLA    Standing Status:   Future    Standing Expiration Date:   05/08/2024    Order Specific Question:   Reason for Exam (SYMPTOM  OR DIAGNOSIS REQUIRED)    Answer:   breast pain/ pain  localized at 10:00 am    Order Specific Question:   Preferred imaging location?    Answer:   [facility]   CBC with Differential/Platelet   Comp Met (CMET)   Lipid panel   TSH   Ambulatory referral to Cardiology    Referral Priority:   Routine    Referral Type:   Consultation    Referral Reason:   Specialty Services Required    Number of Visits Requested:   1    Requested Prescriptions    No prescriptions requested or ordered in this encounter

## 2023-05-10 LAB — COMPREHENSIVE METABOLIC PANEL
AG Ratio: 1.4 (calc) (ref 1.0–2.5)
ALT: 7 U/L (ref 6–29)
AST: 12 U/L (ref 10–30)
Albumin: 4.2 g/dL (ref 3.6–5.1)
Alkaline phosphatase (APISO): 62 U/L (ref 31–125)
BUN: 16 mg/dL (ref 7–25)
CO2: 25 mmol/L (ref 20–32)
Calcium: 9.2 mg/dL (ref 8.6–10.2)
Chloride: 102 mmol/L (ref 98–110)
Creat: 0.82 mg/dL (ref 0.50–0.96)
Globulin: 3.1 g/dL (calc) (ref 1.9–3.7)
Glucose, Bld: 74 mg/dL (ref 65–99)
Potassium: 4.6 mmol/L (ref 3.5–5.3)
Sodium: 135 mmol/L (ref 135–146)
Total Bilirubin: 0.4 mg/dL (ref 0.2–1.2)
Total Protein: 7.3 g/dL (ref 6.1–8.1)

## 2023-05-10 LAB — CBC WITH DIFFERENTIAL/PLATELET
Basophils Relative: 1.1 %
Eosinophils Absolute: 149 cells/uL (ref 15–500)
Lymphs Abs: 2379 cells/uL (ref 850–3900)
MCH: 29.9 pg (ref 27.0–33.0)
Monocytes Relative: 9.7 %
Neutro Abs: 3806 cells/uL (ref 1500–7800)
Platelets: 286 10*3/uL (ref 140–400)
RBC: 4.81 10*6/uL (ref 3.80–5.10)
WBC: 7.1 10*3/uL (ref 3.8–10.8)

## 2023-05-10 LAB — LIPID PANEL
Cholesterol: 177 mg/dL (ref <200)
HDL: 68 mg/dL (ref >=50)
LDL Cholesterol (Calc): 92 mg/dL (calc)
Non-HDL Cholesterol (Calc): 109 mg/dL (calc) (ref <130)
Total CHOL/HDL Ratio: 2.6 (calc) (ref <5.0)
Triglycerides: 79 mg/dL (ref <150)

## 2023-05-10 LAB — TSH: TSH: 1.32 mIU/L

## 2023-05-26 ENCOUNTER — Other Ambulatory Visit: Payer: Self-pay

## 2023-05-26 ENCOUNTER — Ambulatory Visit: Payer: Managed Care, Other (non HMO) | Attending: Interventional Cardiology | Admitting: Interventional Cardiology

## 2023-05-26 ENCOUNTER — Encounter: Payer: Self-pay | Admitting: Interventional Cardiology

## 2023-05-26 VITALS — BP 100/70 | HR 86 | Ht 65.5 in | Wt 141.0 lb

## 2023-05-26 DIAGNOSIS — R002 Palpitations: Secondary | ICD-10-CM

## 2023-05-26 DIAGNOSIS — R233 Spontaneous ecchymoses: Secondary | ICD-10-CM | POA: Diagnosis not present

## 2023-05-26 MED ORDER — LISDEXAMFETAMINE DIMESYLATE 60 MG PO CAPS
60.0000 mg | ORAL_CAPSULE | Freq: Every day | ORAL | 0 refills | Status: DC
  Filled 2023-05-26: qty 30, 30d supply, fill #0

## 2023-05-26 NOTE — Progress Notes (Signed)
Cardiology Office Note   Date:  05/26/2023   ID:  Darlene Wright, DOB July 16, 1991, MRN [id number]  PCP:  Olive Bass, FNP    No chief complaint on file.  palpitations  Wt Readings from Last 3 Encounters:  05/26/23 141 lb (64 kg)  05/09/23 144 lb 12.8 oz (65.7 kg)  06/11/22 147 lb 12.8 oz (67 kg)       History of Present Illness: Darlene Wright is a 32 y.o. female  who I saw in 2022 : "Feels like a hot flash.  Sx seem to resolve after 1 minute.  She was wearing a smart watch.  HR can jump from 75 to 135 suddenly based on her watch.  No ECG tracings available.     Other spikes include 74 to 137, HR can stay above 100 bpm for a while. Feels different than her prior panic attacks. She has kept good records and refers to her phone for her notes.    No triggers identified.  No change with change in position. No chest pain or dizziness with palpitations. "  Has been on Adderal and vyvanse in the past for ADD.   Seeing today for the first time in several years.   Still has HR to the 130s when standing at times.  Now better about hydration and eating regularly.  Still has some tachycardia on days when she is not taking stimulants.    Denies : Chest pain. Dizziness. Nitroglycerin use. Orthopnea.  Paroxysmal nocturnal dyspnea. Shortness of breath. Syncope.    Feels that fingers swell and stay cold.  She eats a lot of pumpkin seeds. Decreased drinking sodas , down to 20 oz/day.    Past Medical History:  Diagnosis Date   Anxiety    Depression    Fibroadenoma of both breasts    GERD (gastroesophageal reflux disease)    Palpitations    PCOS (polycystic ovarian syndrome)     Past Surgical History:  Procedure Laterality Date   LIPOMA EXCISION Right 2023   WISDOM TOOTH EXTRACTION     WISDOM TOOTH EXTRACTION       Current Outpatient Medications  Medication Sig Dispense Refill   albuterol (VENTOLIN HFA) 108 (90 Base) MCG/ACT inhaler Inhale 1-2 puffs into the lungs  every 6 (six) hours as needed for wheezing or shortness of breath. 8 g 1   Biotin 1000 MCG tablet Taking 500 mcg     Cholecalciferol (VITAMIN D3) 10 MCG (400 UNIT) CAPS Take by mouth.     cyanocobalamin (VITAMIN B12) 1000 MCG tablet Take 1,000 mcg by mouth daily.     ferrous gluconate ([facility] IRON) 240 (27 FE) MG tablet Take 1 tablet by mouth daily. (OTC)     hydrOXYzine (ATARAX/VISTARIL) 10 MG tablet Take 10 mg by mouth 3 (three) times daily as needed.     levonorgestrel-ethinyl estradiol (SEASONALE) 0.15-0.03 MG tablet Take 1 tablet by mouth daily. 91 tablet 1   lisdexamfetamine (VYVANSE) 60 MG capsule Take 1 capsule (60 mg total) by mouth daily. 30 capsule 0   Magnesium Chloride (MAGNESIUM DR PO) Take 1 tablet by mouth daily.     Multiple Vitamin (MULTIVITAMIN WITH MINERALS) TABS tablet Take 1 tablet by mouth daily.     Omega-3 Fatty Acids (FISH OIL) 1000 MG CAPS Take by mouth.     ondansetron (ZOFRAN-ODT) 8 MG disintegrating tablet Take 1 tablet (8 mg total) by mouth every 8 (eight) hours as needed for nausea or vomiting. 20 tablet  0   spironolactone (ALDACTONE) 50 MG tablet Take 50 mg by mouth every morning.     tretinoin (RETIN-A) 0.025 % cream Apply 0.025 Units topically at bedtime.     No current facility-administered medications for this visit.    Allergies:   Wound dressing adhesive    Social History:  The patient  reports that she has never smoked. She has been exposed to tobacco smoke. She has never used smokeless tobacco. She reports current alcohol use. She reports that she does not use drugs.   Family History:  The patient's family history includes Alcohol abuse in her father and mother; Bronchitis in her sister; Diabetes in her maternal grandmother and paternal grandfather; Hypertension in her father, maternal grandfather, mother, paternal grandfather, and paternal grandmother; Leukemia in her paternal grandfather; Pneumonia in her sister; Polycystic ovary syndrome in her sister;  Post-traumatic stress disorder in her sister.    ROS:  Please see the history of present illness.   Otherwise, review of systems are positive for some palpitations.   All other systems are reviewed and negative.    PHYSICAL EXAM: VS:  BP 100/70   Pulse 86   Ht 5' 5.5" (1.664 m)   Wt 141 lb (64 kg)   SpO2 98%   BMI 23.11 kg/m  , BMI Body mass index is 23.11 kg/m. GEN: Well nourished, well developed, in no acute distress HEENT: normal Neck: no JVD, carotid bruits, or masses Cardiac: RRR; no murmurs, rubs, or gallops,no edema  Respiratory:  clear to auscultation bilaterally, normal work of breathing GI: soft, nontender, nondistended, + BS MS: no deformity or atrophy Skin: warm and dry, no rash Neuro:  Strength and sensation are intact Psych: euthymic mood, full affect   EKG:   The ekg ordered today demonstrates normal ECG   Recent Labs: 05/09/2023: ALT 7; BUN 16; Creat 0.82; Hemoglobin 14.4; Platelets 286; Potassium 4.6; Sodium 135; TSH 1.32   Lipid Panel    Component Value Date/Time   CHOL 177 05/09/2023 1122   TRIG 79 05/09/2023 1122   HDL 68 05/09/2023 1122   CHOLHDL 2.6 05/09/2023 1122   VLDL 12.2 09/18/2021 1551   LDLCALC 92 05/09/2023 1122     Other studies Reviewed: Additional studies/ records that were reviewed today with results demonstrating: Labs reviewed, c-Met and CBC essentially normal.   ASSESSMENT AND PLAN:  Palpitations: Sx have improved over the years with aggressive hydration.  Salt intake has been high but BP is controlled.  May have some degree of POTS but sx are well managed.  No syncope.  No signs of of heart failure.  She did report some lightheadedness when taking hot showers.  This may be vasodilation mediated.  I did inform her to try to avoid long hot showers to minimize the risk of hypotension in the shower. Easy bruising:  Not on blood thinners.  Normal platelets. Managed by PMD.    Current medicines are reviewed at length with the  patient today.  The patient concerns regarding her medicines were addressed.  The following changes have been made:  No change  Labs/ tests ordered today include: none  Orders Placed This Encounter  Procedures   EKG 12-Lead    Recommend 150 minutes/week of aerobic exercise Low fat, low carb, high fiber diet recommended  Disposition:   FU as needed   Signed, Lance Muss, MD  05/26/2023 9:47 AM    [facility] [address] [place], [place], [place]  [address] Phone: [phone]; Fax: [phone]

## 2023-05-26 NOTE — Patient Instructions (Signed)
Medication Instructions:  Your physician recommends that you continue on your current medications as directed. Please refer to the Current Medication list given to you today.  *If you need a refill on your cardiac medications before your next appointment, please call your pharmacy*   Lab Work: NONE  If you have labs (blood work) drawn today and your tests are completely normal, you will receive your results only by: MyChart Message (if you have MyChart) OR A paper copy in the mail If you have any lab test that is abnormal or we need to change your treatment, we will call you to review the results.   Testing/Procedures: NONE   Follow-Up:As needed At [facility], you and your health needs are our priority.  As part of our continuing mission to provide you with exceptional heart care, we have created designated Provider Care Teams.  These Care Teams include your primary Cardiologist (physician) and Advanced Practice Providers (APPs -  Physician Assistants and Nurse Practitioners) who all work together to provide you with the care you need, when you need it.

## 2023-05-26 NOTE — Progress Notes (Incomplete)
Cardiology Office Note   Date:  05/26/2023   ID:  SALIHAH Wright, DOB Jun 15, 1991, MRN [id number]  PCP:  Olive Bass, FNP    No chief complaint on file.    Wt Readings from Last 3 Encounters:  05/09/23 144 lb 12.8 oz (65.7 kg)  06/11/22 147 lb 12.8 oz (67 kg)  04/09/22 146 lb (66.2 kg)       History of Present Illness: Darlene Wright is a 32 y.o. female  ***    Past Medical History:  Diagnosis Date  . Anxiety   . Depression   . Fibroadenoma of both breasts   . GERD (gastroesophageal reflux disease)   . Palpitations   . PCOS (polycystic ovarian syndrome)     Past Surgical History:  Procedure Laterality Date  . LIPOMA EXCISION Right 2023  . WISDOM TOOTH EXTRACTION    . WISDOM TOOTH EXTRACTION       Current Outpatient Medications  Medication Sig Dispense Refill  . albuterol (VENTOLIN HFA) 108 (90 Base) MCG/ACT inhaler Inhale 1-2 puffs into the lungs every 6 (six) hours as needed for wheezing or shortness of breath. 8 g 1  . Biotin 1000 MCG tablet Taking 500 mcg    . Cholecalciferol (VITAMIN D3) 10 MCG (400 UNIT) CAPS Take by mouth.    . cyanocobalamin (VITAMIN B12) 1000 MCG tablet Take 1,000 mcg by mouth daily.    . hydrOXYzine (ATARAX/VISTARIL) 10 MG tablet Take 10 mg by mouth 3 (three) times daily as needed.    Marland Kitchen levonorgestrel-ethinyl estradiol (SEASONALE) 0.15-0.03 MG tablet Take 1 tablet by mouth daily. 91 tablet 1  . lisdexamfetamine (VYVANSE) 60 MG capsule Take 1 capsule (60 mg total) by mouth daily. 30 capsule 0  . Magnesium Chloride (MAGNESIUM DR PO) Take by mouth.    . Multiple Vitamin (MULTIVITAMIN WITH MINERALS) TABS tablet Take 1 tablet by mouth daily.    . Omega-3 Fatty Acids (FISH OIL) 1000 MG CAPS Take by mouth.    . ondansetron (ZOFRAN-ODT) 8 MG disintegrating tablet Take 1 tablet (8 mg total) by mouth every 8 (eight) hours as needed for nausea or vomiting. 20 tablet 0  . spironolactone (ALDACTONE) 50 MG tablet Take 50 mg by mouth  every morning.    . tretinoin (RETIN-A) 0.05 % cream PLEASE SEE ATTACHED FOR DETAILED DIRECTIONS     No current facility-administered medications for this visit.    Allergies:   Wound dressing adhesive    Social History:  The patient  reports that she has never smoked. She has been exposed to tobacco smoke. She has never used smokeless tobacco. She reports current alcohol use. She reports that she does not use drugs.   Family History:  The patient's ***family history includes Alcohol abuse in her father and mother; Bronchitis in her sister; Diabetes in her maternal grandmother and paternal grandfather; Hypertension in her father, maternal grandfather, mother, paternal grandfather, and paternal grandmother; Leukemia in her paternal grandfather; Pneumonia in her sister; Polycystic ovary syndrome in her sister; Post-traumatic stress disorder in her sister.    ROS:  Please see the history of present illness.   Otherwise, review of systems are positive for ***.   All other systems are reviewed and negative.    PHYSICAL EXAM: VS:  There were no vitals taken for this visit. , BMI There is no height or weight on file to calculate BMI. GEN: Well nourished, well developed, in no acute distress HEENT: normal Neck: no JVD, carotid  bruits, or masses Cardiac: ***RRR; no murmurs, rubs, or gallops,no edema  Respiratory:  clear to auscultation bilaterally, normal work of breathing GI: soft, nontender, nondistended, + BS MS: no deformity or atrophy Skin: warm and dry, no rash Neuro:  Strength and sensation are intact Psych: euthymic mood, full affect   EKG:   The ekg ordered today demonstrates ***   Recent Labs: 05/09/2023: ALT 7; BUN 16; Creat 0.82; Hemoglobin 14.4; Platelets 286; Potassium 4.6; Sodium 135; TSH 1.32   Lipid Panel    Component Value Date/Time   CHOL 177 05/09/2023 1122   TRIG 79 05/09/2023 1122   HDL 68 05/09/2023 1122   CHOLHDL 2.6 05/09/2023 1122   VLDL 12.2 09/18/2021  1551   LDLCALC 92 05/09/2023 1122     Other studies Reviewed: Additional studies/ records that were reviewed today with results demonstrating: ***.   ASSESSMENT AND PLAN:  ***  *** ***   Current medicines are reviewed at length with the patient today.  The patient concerns regarding her medicines were addressed.  The following changes have been made:  No change***  Labs/ tests ordered today include: *** No orders of the defined types were placed in this encounter.   Recommend 150 minutes/week of aerobic exercise Low fat, low carb, high fiber diet recommended  Disposition:   FU in ***   Signed, Lance Muss, MD  05/26/2023 12:03 AM    [facility] [address] [place], [place], [place]  [address] Phone: [phone]; Fax: [phone]

## 2023-06-03 ENCOUNTER — Other Ambulatory Visit: Payer: Self-pay | Admitting: Family

## 2023-06-03 DIAGNOSIS — N644 Mastodynia: Secondary | ICD-10-CM

## 2023-06-03 DIAGNOSIS — Z0000 Encounter for general adult medical examination without abnormal findings: Secondary | ICD-10-CM

## 2023-06-03 DIAGNOSIS — Z13220 Encounter for screening for lipoid disorders: Secondary | ICD-10-CM

## 2023-06-03 DIAGNOSIS — D241 Benign neoplasm of right breast: Secondary | ICD-10-CM

## 2023-06-03 DIAGNOSIS — R000 Tachycardia, unspecified: Secondary | ICD-10-CM

## 2023-06-05 ENCOUNTER — Ambulatory Visit
Admission: RE | Admit: 2023-06-05 | Discharge: 2023-06-05 | Disposition: A | Discharge status: Home / Self Care | Payer: Managed Care, Other (non HMO) | Source: Ambulatory Visit | Attending: Family | Admitting: Family

## 2023-06-05 DIAGNOSIS — N644 Mastodynia: Secondary | ICD-10-CM

## 2023-07-09 ENCOUNTER — Other Ambulatory Visit: Payer: Self-pay

## 2023-07-09 MED ORDER — LISDEXAMFETAMINE DIMESYLATE 60 MG PO CAPS
60.0000 mg | ORAL_CAPSULE | Freq: Every day | ORAL | 0 refills | Status: DC
  Filled 2023-07-09: qty 30, 30d supply, fill #0

## 2023-07-11 ENCOUNTER — Other Ambulatory Visit: Payer: Self-pay

## 2023-08-06 ENCOUNTER — Other Ambulatory Visit: Payer: Self-pay

## 2023-08-06 MED ORDER — LISDEXAMFETAMINE DIMESYLATE 60 MG PO CAPS
60.0000 mg | ORAL_CAPSULE | Freq: Every day | ORAL | 0 refills | Status: DC
  Filled 2023-08-06: qty 30, 30d supply, fill #0

## 2023-08-07 ENCOUNTER — Other Ambulatory Visit: Payer: Self-pay

## 2023-08-19 ENCOUNTER — Telehealth (INDEPENDENT_AMBULATORY_CARE_PROVIDER_SITE_OTHER): Payer: Managed Care, Other (non HMO) | Admitting: Family

## 2023-08-19 ENCOUNTER — Encounter: Payer: Self-pay | Admitting: Family

## 2023-08-19 VITALS — Ht 65.5 in

## 2023-08-19 DIAGNOSIS — R109 Unspecified abdominal pain: Secondary | ICD-10-CM

## 2023-08-19 DIAGNOSIS — R110 Nausea: Secondary | ICD-10-CM | POA: Diagnosis not present

## 2023-08-19 NOTE — Progress Notes (Signed)
Darlene Wright is a 32 y.o. female with the following history as recorded in Epic[employer]:  Patient Active Problem List   Diagnosis Date Noted   Mass of buttock 03/21/2022   Polycystic ovarian syndrome 07/21/2018   BREAST MASS, LEFT 05/08/2009    Current Outpatient Medications  Medication Sig Dispense Refill   albuterol (VENTOLIN HFA) 108 (90 Base) MCG/ACT inhaler Inhale 1-2 puffs into the lungs every 6 (six) hours as needed for wheezing or shortness of breath. 8 g 1   Biotin 1000 MCG tablet Taking 500 mcg     Cholecalciferol (VITAMIN D3) 10 MCG (400 UNIT) CAPS Take by mouth.     cyanocobalamin (VITAMIN B12) 1000 MCG tablet Take 1,000 mcg by mouth daily.     ferrous gluconate ([employer] IRON) 240 (27 FE) MG tablet Take 1 tablet by mouth daily. (OTC)     hydrOXYzine (ATARAX/VISTARIL) 10 MG tablet Take 10 mg by mouth 3 (three) times daily as needed.     levonorgestrel-ethinyl estradiol (SEASONALE) 0.15-0.03 MG tablet Take 1 tablet by mouth daily. 91 tablet 1   lisdexamfetamine (VYVANSE) 60 MG capsule Take 1 capsule (60 mg total) by mouth daily. 30 capsule 0   Magnesium Chloride (MAGNESIUM DR PO) Take 1 tablet by mouth daily.     Multiple Vitamin (MULTIVITAMIN WITH MINERALS) TABS tablet Take 1 tablet by mouth daily.     Omega-3 Fatty Acids (FISH OIL) 1000 MG CAPS Take by mouth.     spironolactone (ALDACTONE) 50 MG tablet Take 50 mg by mouth every morning.     tretinoin (RETIN-A) 0.025 % cream Apply 0.025 Units topically at bedtime.     ondansetron (ZOFRAN-ODT) 8 MG disintegrating tablet Take 1 tablet (8 mg total) by mouth every 8 (eight) hours as needed for nausea or vomiting. (Patient not taking: Reported on 08/19/2023) 20 tablet 0   No current facility-administered medications for this visit.    Allergies: Wound dressing adhesive  Past Medical History:  Diagnosis Date   Anxiety    Depression    Fibroadenoma of both breasts    GERD (gastroesophageal reflux disease)    Palpitations    PCOS  (polycystic ovarian syndrome)     Past Surgical History:  Procedure Laterality Date   LIPOMA EXCISION Right 2023   WISDOM TOOTH EXTRACTION     WISDOM TOOTH EXTRACTION      Family History  Problem Relation Age of Onset   Hypertension Mother    Alcohol abuse Mother    Hypertension Father    Alcohol abuse Father    Diabetes Maternal Grandmother    Hypertension Maternal Grandfather    Hypertension Paternal Grandmother    Leukemia Paternal Grandfather    Diabetes Paternal Grandfather    Hypertension Paternal Grandfather    Polycystic ovary syndrome Sister    Post-traumatic stress disorder Sister    Bronchitis Sister    Pneumonia Sister    Colon cancer Neg Hx    Esophageal cancer Neg Hx    Rectal cancer Neg Hx    Stomach cancer Neg Hx     Social History   Tobacco Use   Smoking status: Never    Passive exposure: Yes   Smokeless tobacco: Never  Substance Use Topics   Alcohol use: Yes    Comment: social    Subjective:   I connected with Darlene Wright on 08/19/23 at 11:20 AM EDT by a video enabled telemedicine application and verified that I am speaking with the correct person using two  identifiers.   I discussed the limitations of evaluation and management by telemedicine and the availability of in person appointments. The patient expressed understanding and agreed to proceed. Provider in office/ patient is at home; provider and patient are only 2 people on video call.   Patient concerned about "mild" daily nausea that started about 2.5 weeks ago; notes that symptoms are not everyday and mostly occur just in the morning; the symptoms did begin right before she had a stressful deadline for work; she has started herself back on Omeprazole 20 mg every day which she has taken in the past for reflux symptoms; did take pregnancy test last week- negative; no abdominal pain or changes in bowel movements; no fever;  Also notes that she started OTC iron about a month ago- she was  concerned that she was anemic; in reviewing notes, last hgb here in July 2024 was at 14.     Objective:  Vitals:   08/19/23 1114  Height: 5' 5.5" (1.664 m)    General: Well developed, well nourished, in no acute distress  Skin : Warm and dry.  Head: Normocephalic and atraumatic  Lungs: Respirations unlabored;  Neurologic: Alert and oriented; speech intact; face symmetrical;   Assessment:  1. Abdominal pain, unspecified abdominal location   2. Nausea     Plan:  ? Gastritis secondary to use of iron supplement for the past month; she is encouraged to stop the iron and take Omeprazole for remainder of the week; will also updated abdominal ultrasound for further evaluation; reviewed labs from July 2024 and will not order updated labs at this time; follow up to be determined.   No follow-ups on file.  No orders of the defined types were placed in this encounter.   Requested Prescriptions    No prescriptions requested or ordered in this encounter

## 2023-08-20 ENCOUNTER — Ambulatory Visit
Admission: RE | Admit: 2023-08-20 | Discharge: 2023-08-20 | Disposition: A | Discharge status: Home / Self Care | Payer: Managed Care, Other (non HMO) | Source: Ambulatory Visit | Attending: Family | Admitting: Family

## 2023-08-20 DIAGNOSIS — R109 Unspecified abdominal pain: Secondary | ICD-10-CM

## 2023-08-20 DIAGNOSIS — R110 Nausea: Secondary | ICD-10-CM

## 2023-08-23 ENCOUNTER — Other Ambulatory Visit: Payer: Self-pay | Admitting: Family

## 2023-08-25 ENCOUNTER — Other Ambulatory Visit: Payer: Self-pay | Admitting: Family

## 2023-09-10 ENCOUNTER — Other Ambulatory Visit: Payer: Self-pay

## 2023-09-10 MED ORDER — LISDEXAMFETAMINE DIMESYLATE 60 MG PO CAPS
60.0000 mg | ORAL_CAPSULE | Freq: Every day | ORAL | 0 refills | Status: DC
  Filled 2023-09-10: qty 30, 30d supply, fill #0

## 2023-10-07 ENCOUNTER — Other Ambulatory Visit: Payer: Self-pay

## 2023-10-07 MED ORDER — LISDEXAMFETAMINE DIMESYLATE 60 MG PO CAPS
60.0000 mg | ORAL_CAPSULE | Freq: Every day | ORAL | 0 refills | Status: DC
  Filled 2023-10-07: qty 30, 30d supply, fill #0

## 2023-10-13 ENCOUNTER — Other Ambulatory Visit: Payer: Self-pay

## 2023-10-13 ENCOUNTER — Ambulatory Visit (INDEPENDENT_AMBULATORY_CARE_PROVIDER_SITE_OTHER): Payer: Managed Care, Other (non HMO) | Admitting: Obstetrics and Gynecology

## 2023-10-13 ENCOUNTER — Encounter: Payer: Self-pay | Admitting: Obstetrics and Gynecology

## 2023-10-13 VITALS — BP 117/73 | HR 71 | Wt 152.0 lb

## 2023-10-13 DIAGNOSIS — Z01419 Encounter for gynecological examination (general) (routine) without abnormal findings: Secondary | ICD-10-CM | POA: Diagnosis not present

## 2023-10-13 DIAGNOSIS — Z1331 Encounter for screening for depression: Secondary | ICD-10-CM

## 2023-10-13 DIAGNOSIS — E282 Polycystic ovarian syndrome: Secondary | ICD-10-CM | POA: Diagnosis not present

## 2023-10-13 DIAGNOSIS — Z3009 Encounter for other general counseling and advice on contraception: Secondary | ICD-10-CM

## 2023-10-13 DIAGNOSIS — L659 Nonscarring hair loss, unspecified: Secondary | ICD-10-CM | POA: Diagnosis not present

## 2023-10-13 DIAGNOSIS — R6882 Decreased libido: Secondary | ICD-10-CM

## 2023-10-13 MED ORDER — SPIRONOLACTONE 50 MG PO TABS: 50.0000 mg | ORAL_TABLET | Freq: Every morning | ORAL | 6 refills | Status: DC

## 2023-10-13 MED ORDER — LEVONORGEST-ETH ESTRAD 91-DAY 0.15-0.03 MG PO TABS: 1.0000 | ORAL_TABLET | Freq: Every day | ORAL | 3 refills | Status: DC

## 2023-10-13 NOTE — Progress Notes (Signed)
ANNUAL EXAM Patient name: Darlene Wright MRN [id number]  Date of birth: [birth year] Chief Complaint:   Gynecologic Exam  History of Present Illness:   Darlene Wright is a 32 y.o. G0P0000 being seen today for a routine annual exam.  Current complaints: annual, renew medications, low libidi  Menstrual concerns? No   Breast or nipple changes? Yes hx of fibroadenoma Contraception use? Yes OCPs Sexually active? 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   The pregnancy intention screening data noted above was reviewed. Potential methods of contraception were discussed. The patient elected to proceed with No data recorded.   Last pap     Component Value Date/Time   DIAGPAP  09/25/2022 1142    - Negative for intraepithelial lesion or malignancy (NILM)   HPVHIGH Negative 09/25/2022 1142   ADEQPAP  09/25/2022 1142    Satisfactory for evaluation; transformation zone component ABSENT.   Last mammogram: 05/2023 BIRADS1 Last colonoscopy: n/a.      10/13/2023    5:00 PM 05/09/2023   11:03 AM 09/25/2022   11:54 AM 06/11/2022    3:26 PM 09/18/2021    3:17 PM  Depression screen PHQ 2/9  Decreased Interest 0 0 1 0 1  Down, Depressed, Hopeless 0 1 1 0 1  PHQ - 2 Score 0 1 2 0 2  Altered sleeping 2  2  3   Tired, decreased energy 1  1  0  Change in appetite 0  0  0  Feeling bad or failure about yourself  0  0  0  Trouble concentrating 1  1   2   Moving slowly or fidgety/restless 0  0  0  Suicidal thoughts 0  0  0  PHQ-9 Score 4  6  7   Difficult doing work/chores     Somewhat difficult        10/13/2023    5:00 PM 09/25/2022   11:54 AM  GAD 7 : Generalized Anxiety Score  Nervous, Anxious, on Edge 2 1  Control/stop worrying 2 2  Worry too much - different things 2 2  Trouble relaxing 1 1  Restless 0 1  Easily annoyed or irritable 0 0  Afraid - awful might happen 0 1  Total GAD 7 Score 7 8     Review of Systems:   Pertinent items are noted in HPI Denies any headaches, blurred vision, fatigue, shortness of breath, chest pain, abdominal pain, abnormal vaginal discharge/itching/odor/irritation, problems with periods, bowel movements, urination, or intercourse unless otherwise stated above. Pertinent History Reviewed:  Reviewed past medical,surgical, social and family history.  Reviewed problem list, medications and allergies. Physical Assessment:   Vitals:   10/13/23 1600  BP: 117/73  Pulse: 71  Weight: 152 lb (68.9 kg)  Body mass index is 24.91 kg/m.        Physical Examination:   General appearance - well appearing, and in no  distress  Mental status - alert, oriented to person, place, and time  Psych:  She has a normal mood and affect  Skin - warm and dry, normal color, no suspicious lesions noted  Chest - effort normal, all lung Pogorzelski clear to auscultation bilaterally  Heart - normal rate and regular rhythm  Breasts - breasts appear normal, no suspicious masses, no skin or nipple changes or  axillary nodes  Abdomen - soft, nontender, nondistended, no masses or organomegaly  Pelvic - deferred  Extremities:  No swelling or varicosities noted  Chaperone present for exam  No results found for this or any previous visit (from the past 24 hours).    Assessment & Plan:   1. Well woman exam with routine gynecological exam (Primary) - Cervical cancer screening: Discussed guidelines. Pap with HPV due 2026 -  GC/CT: declines - Birth Control: OCPs - Breast Health: Encouraged self breast awareness/SBE. Teaching provided.  - F/U 12 months and prn   2. Encounter for counseling regarding contraception Continue OCPs.   3. Polycystic ovarian syndrome Continue OCPs and spironolactone - levonorgestrel-ethinyl estradiol (SEASONALE) 0.15-0.03 MG tablet; Take 1 tablet by mouth daily.  Dispense: 91 tablet; Refill: 3  4. Hair loss Continue spironolactone. Normal CMP on file - spironolactone (ALDACTONE) 50 MG tablet; Take 1 tablet (50 mg total) by mouth every morning.  Dispense: 90 tablet; Refill: 6  5. Low libido Noted possible side effect of OCP as well as psychosocial issues in life. Patient aware of need to re-engage with mental health. Offered referral to [facility] and recommend psychology today as well as patient notes time restrictions in her life for in person appointments but also prefers in person appointments due to importance of provider picking up non-verbal cues.    Orders Placed This Encounter  Procedures   Ambulatory referral to Integrated Behavioral Health    Meds:  Meds ordered this encounter  Medications   levonorgestrel-ethinyl estradiol (SEASONALE) 0.15-0.03 MG tablet    Sig: Take 1 tablet by mouth daily.    Dispense:  91 tablet    Refill:  3   spironolactone (ALDACTONE) 50 MG tablet    Sig: Take 1 tablet (50 mg total) by mouth every morning.    Dispense:  90 tablet    Refill:  6    Follow-up: Return if symptoms worsen or fail to improve, for Annual GYN.  Lorriane Shire, MD 10/13/2023 4:09 PM

## 2023-10-13 NOTE — Patient Instructions (Signed)
Psychology today to find new therapist

## 2023-11-13 ENCOUNTER — Other Ambulatory Visit: Payer: Self-pay

## 2023-11-13 MED ORDER — LISDEXAMFETAMINE DIMESYLATE 60 MG PO CAPS
60.0000 mg | ORAL_CAPSULE | Freq: Every day | ORAL | 0 refills | Status: DC
  Filled 2023-11-13: qty 30, 30d supply, fill #0

## 2023-11-14 ENCOUNTER — Other Ambulatory Visit: Payer: Self-pay

## 2024-01-01 ENCOUNTER — Other Ambulatory Visit: Payer: Self-pay

## 2024-01-01 MED ORDER — LISDEXAMFETAMINE DIMESYLATE 60 MG PO CAPS
60.0000 mg | ORAL_CAPSULE | Freq: Every day | ORAL | 0 refills | Status: DC
  Filled 2024-01-01: qty 30, 30d supply, fill #0

## 2024-01-02 ENCOUNTER — Other Ambulatory Visit: Payer: Self-pay

## 2024-02-09 ENCOUNTER — Other Ambulatory Visit: Payer: Self-pay

## 2024-02-09 MED ORDER — LISDEXAMFETAMINE DIMESYLATE 60 MG PO CAPS
60.0000 mg | ORAL_CAPSULE | Freq: Every day | ORAL | 0 refills | Status: DC
  Filled 2024-02-09: qty 30, 30d supply, fill #0

## 2024-02-11 ENCOUNTER — Other Ambulatory Visit: Payer: Self-pay

## 2024-03-16 ENCOUNTER — Other Ambulatory Visit: Payer: Self-pay

## 2024-03-16 MED ORDER — LISDEXAMFETAMINE DIMESYLATE 60 MG PO CAPS
60.0000 mg | ORAL_CAPSULE | Freq: Every day | ORAL | 0 refills | Status: DC
  Filled 2024-03-16: qty 30, 30d supply, fill #0

## 2024-03-17 ENCOUNTER — Other Ambulatory Visit: Payer: Self-pay

## 2024-04-16 ENCOUNTER — Other Ambulatory Visit: Payer: Self-pay

## 2024-04-16 MED ORDER — LISDEXAMFETAMINE DIMESYLATE 60 MG PO CAPS
60.0000 mg | ORAL_CAPSULE | Freq: Every day | ORAL | 0 refills | Status: DC
  Filled 2024-04-16: qty 30, 30d supply, fill #0

## 2024-04-19 ENCOUNTER — Other Ambulatory Visit: Payer: Self-pay

## 2024-05-07 ENCOUNTER — Encounter (HOSPITAL_COMMUNITY): Payer: Self-pay

## 2024-05-07 ENCOUNTER — Ambulatory Visit (HOSPITAL_COMMUNITY)
Admission: EM | Admit: 2024-05-07 | Discharge: 2024-05-07 | Disposition: A | Discharge status: Home / Self Care | Attending: Family Medicine | Admitting: Family Medicine

## 2024-05-07 DIAGNOSIS — M549 Dorsalgia, unspecified: Secondary | ICD-10-CM | POA: Diagnosis not present

## 2024-05-07 MED ORDER — TIZANIDINE HCL 4 MG PO TABS: 4.0000 mg | ORAL_TABLET | Freq: Three times a day (TID) | ORAL | 0 refills | Status: AC | PRN

## 2024-05-07 MED ORDER — KETOROLAC TROMETHAMINE 30 MG/ML IJ SOLN
30.0000 mg | Freq: Once | INTRAMUSCULAR | Status: AC
  Administered 2024-05-07: 30 mg via INTRAMUSCULAR

## 2024-05-07 MED ORDER — KETOROLAC TROMETHAMINE 10 MG PO TABS: 10.0000 mg | ORAL_TABLET | Freq: Four times a day (QID) | ORAL | 0 refills | Status: AC | PRN

## 2024-05-07 MED ORDER — KETOROLAC TROMETHAMINE 30 MG/ML IJ SOLN
INTRAMUSCULAR | Status: AC
  Filled 2024-05-07: qty 1

## 2024-05-07 NOTE — Discharge Instructions (Addendum)
 You have been given a shot of Toradol 30 mg today.  Ketorolac 10 mg tablets--take 1 tablet every 6 hours as needed for pain.  This is the same medicine that is in the shot we just gave you   Take tizanidine 4 mg--1 every 8 hours as needed for muscle spasms; this medication can cause dizziness and sleepiness  Heating pad or warm compress can help.  Please follow-up with your primary care

## 2024-05-07 NOTE — ED Provider Notes (Signed)
 [facility]    CSN: [id number] Arrival date & time: 05/07/24  9191      History   Chief Complaint Chief Complaint  Patient presents with   Back Pain    HPI Darlene Wright is a 33 y.o. female.    Back Pain  Here for left upper back pain, began about 2 days ago. No trauma or fall.   Worked out on 7/14. 7/15 had a muscle cramp in her foot, and bent forward a prolonged time stretching that out, then the upper back pain began evening of 7/16  It is sharp and just medial to her left scapula. Sometimes is having some radiation into left arm with movement, and her neck is now stiff and having a hard time turning her head.  No numbness or tingling  No rash noted.  LMP about 2 months ago.  NKDA  Has been taking ibuprofen  400 mg and had a massage also. These things have helped a little, but pain worsens again after effect wears off. Past Medical History:  Diagnosis Date   Anxiety    Depression    Fibroadenoma of both breasts    GERD (gastroesophageal reflux disease)    Palpitations    PCOS (polycystic ovarian syndrome)     Patient Active Problem List   Diagnosis Date Noted   Mass of buttock 03/21/2022   Polycystic ovarian syndrome 07/21/2018   BREAST MASS, LEFT 05/08/2009    Past Surgical History:  Procedure Laterality Date   LIPOMA EXCISION Right 2023   WISDOM TOOTH EXTRACTION     WISDOM TOOTH EXTRACTION      OB History     Gravida  0   Para  0   Term  0   Preterm  0   AB  0   Living  0      SAB  0   IAB  0   Ectopic  0   Multiple  0   Live Births  0            Home Medications    Prior to Admission medications   Medication Sig Start Date End Date Taking? Authorizing Provider  Biotin 1000 MCG tablet Taking 500 mcg 07/21/18  Yes [provider]  Cholecalciferol (VITAMIN D3) 10 MCG (400 UNIT) CAPS Take by mouth.   Yes [provider]  cyanocobalamin (VITAMIN B12) 1000 MCG tablet Take 1,000 mcg by mouth  daily.   Yes [provider]  hydrOXYzine (ATARAX/VISTARIL) 10 MG tablet Take 10 mg by mouth 3 (three) times daily as needed.   Yes [provider]  ketorolac (TORADOL) 10 MG tablet Take 1 tablet (10 mg total) by mouth every 6 (six) hours as needed (pain). 05/07/24  Yes Vonna Sharlet POUR, MD  levonorgestrel -ethinyl estradiol (SEASONALE) 0.15-0.03 MG tablet Take 1 tablet by mouth daily. 10/13/23  Yes Ajewole, Christana, MD  lisdexamfetamine (VYVANSE ) 60 MG capsule Take 1 capsule by mouth daily 04/16/24  Yes   Magnesium Chloride (MAGNESIUM DR PO) Take 1 tablet by mouth daily.   Yes [provider]  Multiple Vitamin (MULTIVITAMIN WITH MINERALS) TABS tablet Take 1 tablet by mouth daily.   Yes [provider]  Omega-3 Fatty Acids (FISH OIL) 1000 MG CAPS Take by mouth.   Yes [provider]  spironolactone  (ALDACTONE ) 50 MG tablet Take 1 tablet (50 mg total) by mouth every morning. 10/13/23  Yes Ajewole, Christana, MD  tiZANidine (ZANAFLEX) 4 MG tablet Take 1 tablet (4  mg total) by mouth every 8 (eight) hours as needed for muscle spasms. 05/07/24  Yes Vonna Sharlet POUR, MD  albuterol  (VENTOLIN  HFA) 108 (90 Base) MCG/ACT inhaler Inhale 1-2 puffs into the lungs every 6 (six) hours as needed for wheezing or shortness of breath. 09/18/21   Jason Leita Repine, FNP  tretinoin (RETIN-A) 0.025 % cream Apply 0.025 Units topically at bedtime.    [provider]  Levonorgestrel  ([name]  IU) by Intrauterine route.  06/30/19  [provider]    Family History Family History  Problem Relation Age of Onset   Hypertension Mother    Alcohol abuse Mother    Hypertension Father    Alcohol abuse Father    Diabetes Maternal Grandmother    Hypertension Maternal Grandfather    Hypertension Paternal Grandmother    Leukemia Paternal Grandfather    Diabetes Paternal Grandfather    Hypertension Paternal Grandfather    Polycystic ovary syndrome Sister     Post-traumatic stress disorder Sister    Bronchitis Sister    Pneumonia Sister    Colon cancer Neg Hx    Esophageal cancer Neg Hx    Rectal cancer Neg Hx    Stomach cancer Neg Hx     Social History Social History   Tobacco Use   Smoking status: Never    Passive exposure: Yes   Smokeless tobacco: Never  Vaping Use   Vaping status: Never Used  Substance Use Topics   Alcohol use: Yes    Comment: social   Drug use: No     Allergies   Wound dressing adhesive   Review of Systems Review of Systems  Musculoskeletal:  Positive for back pain.     Physical Exam Triage Vital Signs ED Triage Vitals  Encounter Vitals Group     BP 05/07/24 0843 123/82     Girls Systolic BP Percentile --      Girls Diastolic BP Percentile --      Boys Systolic BP Percentile --      Boys Diastolic BP Percentile --      Pulse Rate 05/07/24 0843 82     Resp 05/07/24 0843 18     Temp 05/07/24 0843 98.5 F (36.9 C)     Temp Source 05/07/24 0843 Oral     SpO2 05/07/24 0843 97 %     Weight 05/07/24 0842 147 lb (66.7 kg)     Height 05/07/24 0842 5' 5.5 (1.664 m)     Head Circumference --      Peak Flow --      Pain Score 05/07/24 0840 7     Pain Loc --      Pain Education --      Exclude from Growth Chart --    No data found.  Updated Vital Signs BP 123/82 (BP Location: Left Arm)   Pulse 82   Temp 98.5 F (36.9 C) (Oral)   Resp 18   Ht 5' 5.5 (1.664 m)   Wt 66.7 kg   LMP  (LMP Unknown)   SpO2 97%   BMI 24.09 kg/m   Visual Acuity Right Eye Distance:   Left Eye Distance:   Bilateral Distance:    Right Eye Near:   Left Eye Near:    Bilateral Near:     Physical Exam Vitals reviewed.  Constitutional:      General: She is not in acute distress.    Appearance: She is not toxic-appearing.  HENT:     Mouth/Throat:  Mouth: Mucous membranes are moist.  Neck:     Comments: There is tenderness and spasm of the left trapezius in the neck Cardiovascular:     Rate and  Rhythm: Normal rate and regular rhythm.     Heart sounds: No murmur heard. Pulmonary:     Effort: Pulmonary effort is normal.     Breath sounds: Normal breath sounds.  Musculoskeletal:        General: No deformity.     Comments: There is tenderness and spasm of the left trapezius and the posterior shoulder and medial to the scapula.  No rash or deformity or erythema Left shoulder has normal range of motion  Lymphadenopathy:     Cervical: No cervical adenopathy.  Skin:    Coloration: Skin is not jaundiced or pale.  Neurological:     General: No focal deficit present.     Mental Status: She is alert and oriented to person, place, and time.  Psychiatric:        Behavior: Behavior normal.      UC Treatments / Results  Labs (all labs ordered are listed, but only abnormal results are displayed) Labs Reviewed - No data to display  EKG   Radiology No results found.  Procedures Procedures (including critical care time)  Medications Ordered in UC Medications  ketorolac (TORADOL) 30 MG/ML injection 30 mg (has no administration in time range)    Initial Impression / Assessment and Plan / UC Course  I have reviewed the triage vital signs and the nursing notes.  Pertinent labs & imaging results that were available during my care of the patient were reviewed by me and considered in my medical decision making (see chart for details).     Toradol injection is given here and Toradol tablets are sent to pharmacy.  Tizanidine is sent in as a muscle relaxer.  She is going to get another massage and is going to use them with her to help relieve the muscle spasm.  I have asked her to follow-up with her primary care. Final Clinical Impressions(s) / UC Diagnoses   Final diagnoses:  Upper back pain on left side     Discharge Instructions      You have been given a shot of Toradol 30 mg today.  Ketorolac 10 mg tablets--take 1 tablet every 6 hours as needed for pain.  This is the  same medicine that is in the shot we just gave you   Take tizanidine 4 mg--1 every 8 hours as needed for muscle spasms; this medication can cause dizziness and sleepiness  Heating pad or warm compress can help.     ED Prescriptions     Medication Sig Dispense Auth. Provider   ketorolac (TORADOL) 10 MG tablet Take 1 tablet (10 mg total) by mouth every 6 (six) hours as needed (pain). 20 tablet Teneil Shiller K, MD   tiZANidine (ZANAFLEX) 4 MG tablet Take 1 tablet (4 mg total) by mouth every 8 (eight) hours as needed for muscle spasms. 15 tablet Chimamanda Siegfried K, MD      PDMP not reviewed this encounter.   Vonna Sharlet POUR, MD 05/07/24 [phone]

## 2024-05-07 NOTE — ED Triage Notes (Signed)
 Patient presenting with upper left side back pain onset 2 days ago. States Monday she was working out and then went to stand on a chair, states the foot cramped up but she did not fall. Sates that was the only new thing, no known falls or injuries.   Prescriptions or OTC medications tried: Yes- 400 mg ibuprofen  at 0700 today q6h    with no relief

## 2024-05-11 ENCOUNTER — Other Ambulatory Visit: Payer: Self-pay

## 2024-05-14 ENCOUNTER — Other Ambulatory Visit: Payer: Self-pay

## 2024-05-14 MED ORDER — LISDEXAMFETAMINE DIMESYLATE 60 MG PO CAPS
60.0000 mg | ORAL_CAPSULE | Freq: Every day | ORAL | 0 refills | Status: DC
  Filled 2024-05-17: qty 30, 30d supply, fill #0

## 2024-05-17 ENCOUNTER — Other Ambulatory Visit: Payer: Self-pay

## 2024-06-16 ENCOUNTER — Other Ambulatory Visit: Payer: Self-pay

## 2024-06-16 MED ORDER — LISDEXAMFETAMINE DIMESYLATE 60 MG PO CAPS
60.0000 mg | ORAL_CAPSULE | Freq: Every day | ORAL | 0 refills | Status: DC
  Filled 2024-06-16: qty 30, 30d supply, fill #0

## 2024-06-17 ENCOUNTER — Other Ambulatory Visit: Payer: Self-pay

## 2024-06-22 ENCOUNTER — Other Ambulatory Visit: Payer: Self-pay

## 2024-07-19 ENCOUNTER — Other Ambulatory Visit: Payer: Self-pay

## 2024-07-19 MED ORDER — LISDEXAMFETAMINE DIMESYLATE 60 MG PO CAPS
60.0000 mg | ORAL_CAPSULE | Freq: Every day | ORAL | 0 refills | Status: DC
  Filled 2024-07-20: qty 30, 30d supply, fill #0

## 2024-07-20 ENCOUNTER — Other Ambulatory Visit: Payer: Self-pay

## 2024-08-16 ENCOUNTER — Other Ambulatory Visit: Payer: Self-pay

## 2024-08-16 MED ORDER — LISDEXAMFETAMINE DIMESYLATE 60 MG PO CAPS
60.0000 mg | ORAL_CAPSULE | Freq: Every day | ORAL | 0 refills | Status: DC
  Filled 2024-08-17: qty 30, 30d supply, fill #0

## 2024-08-17 ENCOUNTER — Other Ambulatory Visit: Payer: Self-pay

## 2024-09-18 ENCOUNTER — Other Ambulatory Visit: Payer: Self-pay

## 2024-09-18 MED ORDER — LISDEXAMFETAMINE DIMESYLATE 60 MG PO CAPS
60.0000 mg | ORAL_CAPSULE | Freq: Every day | ORAL | 0 refills | Status: DC
  Filled 2024-09-18: qty 30, 30d supply, fill #0

## 2024-09-20 ENCOUNTER — Other Ambulatory Visit: Payer: Self-pay

## 2024-09-21 ENCOUNTER — Other Ambulatory Visit: Payer: Self-pay

## 2024-10-01 ENCOUNTER — Telehealth: Admitting: Family Medicine

## 2024-10-01 DIAGNOSIS — J40 Bronchitis, not specified as acute or chronic: Secondary | ICD-10-CM

## 2024-10-01 MED ORDER — AZITHROMYCIN 250 MG PO TABS: ORAL_TABLET | ORAL | 0 refills | Status: AC

## 2024-10-01 NOTE — Progress Notes (Signed)
 Virtual Visit Consent   Darlene Wright, you are scheduled for a virtual visit with a [facility] provider today. Just as with appointments in the office, your consent must be obtained to participate. Your consent will be active for this visit and any virtual visit you may have with one of our providers in the next 365 days. If you have a MyChart account, a copy of this consent can be sent to you electronically.  As this is a virtual visit, video technology does not allow for your provider to perform a traditional examination. This may limit your provider's ability to fully assess your condition. If your provider identifies any concerns that need to be evaluated in person or the need to arrange testing (such as labs, EKG, etc.), we will make arrangements to do so. Although advances in technology are sophisticated, we cannot ensure that it will always work on either your end or our end. If the connection with a video visit is poor, the visit may have to be switched to a telephone visit. With either a video or telephone visit, we are not always able to ensure that we have a secure connection.  By engaging in this virtual visit, you consent to the provision of healthcare and authorize for your insurance to be billed (if applicable) for the services provided during this visit. Depending on your insurance coverage, you may receive a charge related to this service.  I need to obtain your verbal consent now. Are you willing to proceed with your visit today? Darlene Wright has provided verbal consent on 10/01/2024 for a virtual visit (video or telephone). Loa Lamp, FNP  Date: 10/01/2024 11:06 AM   Virtual Visit via Video Note   I, Loa Lamp, connected with  Darlene Wright  ([id number], [birth year]) on 10/01/2024 at 11:00 AM EST by a video-enabled telemedicine application and verified that I am speaking with the correct person using two identifiers.  Location: Patient: Virtual Visit Location Patient:  Home Provider: Virtual Visit Location Provider: Home Office   I discussed the limitations of evaluation and management by telemedicine and the availability of in person appointments. The patient expressed understanding and agreed to proceed.    History of Present Illness: Darlene Wright is a 33 y.o. who identifies as a female who was assigned female at birth, and is being seen today for cough, sore throat, post nasal drainage, with sx worsening. Working from home due to illness. The cough is worsening. Sx for 5-6 days. No wheezing or sob. Her GERD makes it worse. No fever now   HPI: HPI  Problems:  Patient Active Problem List   Diagnosis Date Noted   Mass of buttock 03/21/2022   Polycystic ovarian syndrome 07/21/2018   BREAST MASS, LEFT 05/08/2009    Allergies: Allergies[1] Medications: Current Medications[2]  Observations/Objective: Patient is well-developed, well-nourished in no acute distress.  Resting comfortably  at home.  Head is normocephalic, atraumatic.  No labored breathing.  Speech is clear and coherent with logical content.  Patient is alert and oriented at baseline.    Assessment and Plan: 1. Bronchitis (Primary)  Increase fluids, humidifer, UC if sx worsen.   Follow Up Instructions: I discussed the assessment and treatment plan with the patient. The patient was provided an opportunity to ask questions and all were answered. The patient agreed with the plan and demonstrated an understanding of the instructions.  A copy of instructions were sent to the patient via MyChart unless otherwise noted below.  The patient was advised to call back or seek an in-person evaluation if the symptoms worsen or if the condition fails to improve as anticipated.    Deanda Ruddell, FNP     [1]  Allergies Allergen Reactions   Wound Dressing Adhesive Dermatitis    Found when she had to use a Holter monitor. Unable to wear due to blisters   [2]  Current Outpatient  Medications:    albuterol  (VENTOLIN  HFA) 108 (90 Base) MCG/ACT inhaler, Inhale 1-2 puffs into the lungs every 6 (six) hours as needed for wheezing or shortness of breath., Disp: 8 g, Rfl: 1   Biotin 1000 MCG tablet, Taking 500 mcg, Disp: , Rfl:    Cholecalciferol (VITAMIN D3) 10 MCG (400 UNIT) CAPS, Take by mouth., Disp: , Rfl:    cyanocobalamin (VITAMIN B12) 1000 MCG tablet, Take 1,000 mcg by mouth daily., Disp: , Rfl:    hydrOXYzine (ATARAX/VISTARIL) 10 MG tablet, Take 10 mg by mouth 3 (three) times daily as needed., Disp: , Rfl:    ketorolac  (TORADOL ) 10 MG tablet, Take 1 tablet (10 mg total) by mouth every 6 (six) hours as needed (pain)., Disp: 20 tablet, Rfl: 0   levonorgestrel -ethinyl estradiol (SEASONALE) 0.15-0.03 MG tablet, Take 1 tablet by mouth daily., Disp: 91 tablet, Rfl: 3   lisdexamfetamine (VYVANSE ) 60 MG capsule, Take 1 capsule (60 mg total) by mouth daily., Disp: 30 capsule, Rfl: 0   Magnesium Chloride (MAGNESIUM DR PO), Take 1 tablet by mouth daily., Disp: , Rfl:    Multiple Vitamin (MULTIVITAMIN WITH MINERALS) TABS tablet, Take 1 tablet by mouth daily., Disp: , Rfl:    Omega-3 Fatty Acids (FISH OIL) 1000 MG CAPS, Take by mouth., Disp: , Rfl:    spironolactone  (ALDACTONE ) 50 MG tablet, Take 1 tablet (50 mg total) by mouth every morning., Disp: 90 tablet, Rfl: 6   tiZANidine  (ZANAFLEX ) 4 MG tablet, Take 1 tablet (4 mg total) by mouth every 8 (eight) hours as needed for muscle spasms., Disp: 15 tablet, Rfl: 0   tretinoin (RETIN-A) 0.025 % cream, Apply 0.025 Units topically at bedtime., Disp: , Rfl:

## 2024-10-01 NOTE — Patient Instructions (Signed)

## 2024-10-19 ENCOUNTER — Other Ambulatory Visit: Payer: Self-pay

## 2024-10-19 MED ORDER — LISDEXAMFETAMINE DIMESYLATE 60 MG PO CAPS
60.0000 mg | ORAL_CAPSULE | Freq: Every day | ORAL | 0 refills | Status: DC
  Filled 2024-10-19: qty 30, 30d supply, fill #0

## 2024-10-23 ENCOUNTER — Other Ambulatory Visit: Payer: Self-pay | Admitting: Obstetrics and Gynecology

## 2024-10-23 DIAGNOSIS — L659 Nonscarring hair loss, unspecified: Secondary | ICD-10-CM

## 2024-10-25 ENCOUNTER — Telehealth: Payer: Self-pay | Admitting: Family Medicine

## 2024-10-25 NOTE — Telephone Encounter (Signed)
 Called patient and left detailed message about upcoming appointment. Left office number for patient to call if she needs to reschedule.

## 2024-11-17 ENCOUNTER — Other Ambulatory Visit: Payer: Self-pay

## 2024-11-17 MED ORDER — LISDEXAMFETAMINE DIMESYLATE 60 MG PO CAPS
60.0000 mg | ORAL_CAPSULE | Freq: Every day | ORAL | 0 refills | Status: AC
  Filled 2024-11-17: qty 30, 30d supply, fill #0

## 2024-11-19 ENCOUNTER — Other Ambulatory Visit: Payer: Self-pay

## 2024-11-20 ENCOUNTER — Other Ambulatory Visit: Payer: Self-pay | Admitting: Obstetrics and Gynecology

## 2024-11-20 DIAGNOSIS — E282 Polycystic ovarian syndrome: Secondary | ICD-10-CM

## 2024-12-07 ENCOUNTER — Ambulatory Visit: Payer: Self-pay | Admitting: Obstetrics and Gynecology
